# Patient Record
Sex: Female | Born: 1940 | Race: White | Hispanic: No | Marital: Single | Smoking: Never smoker
Health system: Southern US, Community
[De-identification: ages and names within clinical notes are randomized; demographics above are authoritative.]

## PROBLEM LIST (undated history)

## (undated) DIAGNOSIS — K5792 Diverticulitis of intestine, part unspecified, without perforation or abscess without bleeding: Secondary | ICD-10-CM

## (undated) DIAGNOSIS — I83893 Varicose veins of bilateral lower extremities with other complications: Secondary | ICD-10-CM

## (undated) DIAGNOSIS — E785 Hyperlipidemia, unspecified: Secondary | ICD-10-CM

## (undated) DIAGNOSIS — R519 Headache, unspecified: Secondary | ICD-10-CM

## (undated) DIAGNOSIS — H9190 Unspecified hearing loss, unspecified ear: Secondary | ICD-10-CM

## (undated) DIAGNOSIS — I10 Essential (primary) hypertension: Secondary | ICD-10-CM

## (undated) DIAGNOSIS — R51 Headache: Secondary | ICD-10-CM

## (undated) DIAGNOSIS — E079 Disorder of thyroid, unspecified: Secondary | ICD-10-CM

## (undated) HISTORY — PX: KNEE ARTHROSCOPY: SUR90

## (undated) HISTORY — DX: Unspecified hearing loss, unspecified ear: H91.90

## (undated) HISTORY — PX: OVARY SURGERY: SHX727

## (undated) HISTORY — PX: KNEE SURGERY: SHX244

## (undated) HISTORY — PX: OTHER SURGICAL HISTORY: SHX169

## (undated) HISTORY — DX: Hyperlipidemia, unspecified: E78.5

## (undated) HISTORY — DX: Headache, unspecified: R51.9

## (undated) HISTORY — DX: Essential (primary) hypertension: I10

## (undated) HISTORY — PX: COLONOSCOPY W/ POLYPECTOMY: SHX1380

## (undated) HISTORY — PX: CHOLECYSTECTOMY: SHX55

## (undated) HISTORY — DX: Varicose veins of bilateral lower extremities with other complications: I83.893

## (undated) HISTORY — DX: Headache: R51

---

## 2011-05-29 DIAGNOSIS — R918 Other nonspecific abnormal finding of lung field: Secondary | ICD-10-CM | POA: Diagnosis not present

## 2011-06-14 DIAGNOSIS — E785 Hyperlipidemia, unspecified: Secondary | ICD-10-CM | POA: Diagnosis not present

## 2011-07-30 DIAGNOSIS — J45909 Unspecified asthma, uncomplicated: Secondary | ICD-10-CM | POA: Diagnosis not present

## 2011-07-30 DIAGNOSIS — R05 Cough: Secondary | ICD-10-CM | POA: Diagnosis not present

## 2011-11-17 DIAGNOSIS — R918 Other nonspecific abnormal finding of lung field: Secondary | ICD-10-CM | POA: Diagnosis not present

## 2011-12-21 DIAGNOSIS — E782 Mixed hyperlipidemia: Secondary | ICD-10-CM | POA: Diagnosis not present

## 2011-12-21 DIAGNOSIS — R002 Palpitations: Secondary | ICD-10-CM | POA: Diagnosis not present

## 2011-12-21 DIAGNOSIS — K219 Gastro-esophageal reflux disease without esophagitis: Secondary | ICD-10-CM | POA: Diagnosis not present

## 2011-12-21 DIAGNOSIS — E038 Other specified hypothyroidism: Secondary | ICD-10-CM | POA: Diagnosis not present

## 2011-12-21 DIAGNOSIS — Z23 Encounter for immunization: Secondary | ICD-10-CM | POA: Diagnosis not present

## 2011-12-26 DIAGNOSIS — E039 Hypothyroidism, unspecified: Secondary | ICD-10-CM | POA: Diagnosis not present

## 2011-12-26 DIAGNOSIS — E785 Hyperlipidemia, unspecified: Secondary | ICD-10-CM | POA: Diagnosis not present

## 2011-12-26 DIAGNOSIS — R002 Palpitations: Secondary | ICD-10-CM | POA: Diagnosis not present

## 2011-12-26 DIAGNOSIS — Z79899 Other long term (current) drug therapy: Secondary | ICD-10-CM | POA: Diagnosis not present

## 2011-12-28 DIAGNOSIS — R9431 Abnormal electrocardiogram [ECG] [EKG]: Secondary | ICD-10-CM | POA: Diagnosis not present

## 2011-12-30 DIAGNOSIS — R002 Palpitations: Secondary | ICD-10-CM | POA: Diagnosis not present

## 2012-01-01 DIAGNOSIS — R141 Gas pain: Secondary | ICD-10-CM | POA: Diagnosis not present

## 2012-01-01 DIAGNOSIS — R109 Unspecified abdominal pain: Secondary | ICD-10-CM | POA: Diagnosis not present

## 2012-01-01 DIAGNOSIS — Z8371 Family history of colonic polyps: Secondary | ICD-10-CM | POA: Diagnosis not present

## 2012-01-01 DIAGNOSIS — Z86010 Personal history of colonic polyps: Secondary | ICD-10-CM | POA: Diagnosis not present

## 2012-01-01 DIAGNOSIS — R1013 Epigastric pain: Secondary | ICD-10-CM | POA: Diagnosis not present

## 2012-01-01 DIAGNOSIS — R143 Flatulence: Secondary | ICD-10-CM | POA: Diagnosis not present

## 2012-01-01 DIAGNOSIS — Z800 Family history of malignant neoplasm of digestive organs: Secondary | ICD-10-CM | POA: Diagnosis not present

## 2012-01-03 DIAGNOSIS — R002 Palpitations: Secondary | ICD-10-CM | POA: Diagnosis not present

## 2012-02-06 DIAGNOSIS — R002 Palpitations: Secondary | ICD-10-CM | POA: Diagnosis not present

## 2012-03-22 DIAGNOSIS — L719 Rosacea, unspecified: Secondary | ICD-10-CM | POA: Diagnosis not present

## 2012-03-22 DIAGNOSIS — K219 Gastro-esophageal reflux disease without esophagitis: Secondary | ICD-10-CM | POA: Diagnosis not present

## 2012-03-22 DIAGNOSIS — J019 Acute sinusitis, unspecified: Secondary | ICD-10-CM | POA: Diagnosis not present

## 2012-03-22 DIAGNOSIS — K2960 Other gastritis without bleeding: Secondary | ICD-10-CM | POA: Diagnosis not present

## 2012-04-29 DIAGNOSIS — R1032 Left lower quadrant pain: Secondary | ICD-10-CM | POA: Diagnosis not present

## 2012-04-29 DIAGNOSIS — R350 Frequency of micturition: Secondary | ICD-10-CM | POA: Diagnosis not present

## 2012-04-29 DIAGNOSIS — R928 Other abnormal and inconclusive findings on diagnostic imaging of breast: Secondary | ICD-10-CM | POA: Diagnosis not present

## 2012-04-29 DIAGNOSIS — R198 Other specified symptoms and signs involving the digestive system and abdomen: Secondary | ICD-10-CM | POA: Diagnosis not present

## 2012-04-29 DIAGNOSIS — R922 Inconclusive mammogram: Secondary | ICD-10-CM | POA: Diagnosis not present

## 2012-04-29 DIAGNOSIS — R141 Gas pain: Secondary | ICD-10-CM | POA: Diagnosis not present

## 2012-04-29 DIAGNOSIS — R142 Eructation: Secondary | ICD-10-CM | POA: Diagnosis not present

## 2012-05-13 DIAGNOSIS — Z8371 Family history of colonic polyps: Secondary | ICD-10-CM | POA: Diagnosis not present

## 2012-05-13 DIAGNOSIS — D126 Benign neoplasm of colon, unspecified: Secondary | ICD-10-CM | POA: Diagnosis not present

## 2012-05-13 DIAGNOSIS — Z800 Family history of malignant neoplasm of digestive organs: Secondary | ICD-10-CM | POA: Diagnosis not present

## 2012-05-13 DIAGNOSIS — R109 Unspecified abdominal pain: Secondary | ICD-10-CM | POA: Diagnosis not present

## 2012-05-13 DIAGNOSIS — R143 Flatulence: Secondary | ICD-10-CM | POA: Diagnosis not present

## 2012-05-13 DIAGNOSIS — R142 Eructation: Secondary | ICD-10-CM | POA: Diagnosis not present

## 2012-05-13 DIAGNOSIS — R141 Gas pain: Secondary | ICD-10-CM | POA: Diagnosis not present

## 2012-05-16 DIAGNOSIS — R1084 Generalized abdominal pain: Secondary | ICD-10-CM | POA: Diagnosis not present

## 2012-05-29 DIAGNOSIS — Z5181 Encounter for therapeutic drug level monitoring: Secondary | ICD-10-CM | POA: Diagnosis not present

## 2012-05-29 DIAGNOSIS — R1013 Epigastric pain: Secondary | ICD-10-CM | POA: Diagnosis not present

## 2012-05-29 DIAGNOSIS — R1310 Dysphagia, unspecified: Secondary | ICD-10-CM | POA: Diagnosis not present

## 2012-05-29 DIAGNOSIS — R141 Gas pain: Secondary | ICD-10-CM | POA: Diagnosis not present

## 2012-05-29 DIAGNOSIS — K228 Other specified diseases of esophagus: Secondary | ICD-10-CM | POA: Diagnosis not present

## 2012-05-29 DIAGNOSIS — K219 Gastro-esophageal reflux disease without esophagitis: Secondary | ICD-10-CM | POA: Diagnosis not present

## 2012-05-29 DIAGNOSIS — R143 Flatulence: Secondary | ICD-10-CM | POA: Diagnosis not present

## 2012-10-17 DIAGNOSIS — K219 Gastro-esophageal reflux disease without esophagitis: Secondary | ICD-10-CM | POA: Diagnosis not present

## 2012-10-17 DIAGNOSIS — E782 Mixed hyperlipidemia: Secondary | ICD-10-CM | POA: Diagnosis not present

## 2012-10-17 DIAGNOSIS — E038 Other specified hypothyroidism: Secondary | ICD-10-CM | POA: Diagnosis not present

## 2012-10-17 DIAGNOSIS — J018 Other acute sinusitis: Secondary | ICD-10-CM | POA: Diagnosis not present

## 2012-11-12 DIAGNOSIS — Z79899 Other long term (current) drug therapy: Secondary | ICD-10-CM | POA: Diagnosis not present

## 2012-11-12 DIAGNOSIS — E039 Hypothyroidism, unspecified: Secondary | ICD-10-CM | POA: Diagnosis not present

## 2012-11-12 DIAGNOSIS — E785 Hyperlipidemia, unspecified: Secondary | ICD-10-CM | POA: Diagnosis not present

## 2012-11-12 DIAGNOSIS — Z139 Encounter for screening, unspecified: Secondary | ICD-10-CM | POA: Diagnosis not present

## 2013-01-07 DIAGNOSIS — M999 Biomechanical lesion, unspecified: Secondary | ICD-10-CM | POA: Diagnosis not present

## 2013-01-07 DIAGNOSIS — IMO0002 Reserved for concepts with insufficient information to code with codable children: Secondary | ICD-10-CM | POA: Diagnosis not present

## 2013-01-09 DIAGNOSIS — M999 Biomechanical lesion, unspecified: Secondary | ICD-10-CM | POA: Diagnosis not present

## 2013-01-09 DIAGNOSIS — IMO0002 Reserved for concepts with insufficient information to code with codable children: Secondary | ICD-10-CM | POA: Diagnosis not present

## 2013-01-10 DIAGNOSIS — M999 Biomechanical lesion, unspecified: Secondary | ICD-10-CM | POA: Diagnosis not present

## 2013-01-10 DIAGNOSIS — IMO0002 Reserved for concepts with insufficient information to code with codable children: Secondary | ICD-10-CM | POA: Diagnosis not present

## 2013-01-30 DIAGNOSIS — Z23 Encounter for immunization: Secondary | ICD-10-CM | POA: Diagnosis not present

## 2013-02-05 DIAGNOSIS — H903 Sensorineural hearing loss, bilateral: Secondary | ICD-10-CM | POA: Diagnosis not present

## 2013-02-13 DIAGNOSIS — L570 Actinic keratosis: Secondary | ICD-10-CM | POA: Diagnosis not present

## 2013-02-13 DIAGNOSIS — L819 Disorder of pigmentation, unspecified: Secondary | ICD-10-CM | POA: Diagnosis not present

## 2013-02-13 DIAGNOSIS — D235 Other benign neoplasm of skin of trunk: Secondary | ICD-10-CM | POA: Diagnosis not present

## 2013-02-26 DIAGNOSIS — G3184 Mild cognitive impairment, so stated: Secondary | ICD-10-CM | POA: Diagnosis not present

## 2013-02-26 DIAGNOSIS — E785 Hyperlipidemia, unspecified: Secondary | ICD-10-CM | POA: Diagnosis not present

## 2013-02-26 DIAGNOSIS — E039 Hypothyroidism, unspecified: Secondary | ICD-10-CM | POA: Diagnosis not present

## 2013-02-26 DIAGNOSIS — Z1331 Encounter for screening for depression: Secondary | ICD-10-CM | POA: Diagnosis not present

## 2013-02-26 DIAGNOSIS — M25569 Pain in unspecified knee: Secondary | ICD-10-CM | POA: Diagnosis not present

## 2013-03-13 DIAGNOSIS — M25569 Pain in unspecified knee: Secondary | ICD-10-CM | POA: Diagnosis not present

## 2013-03-18 DIAGNOSIS — M25569 Pain in unspecified knee: Secondary | ICD-10-CM | POA: Diagnosis not present

## 2013-03-20 DIAGNOSIS — M25569 Pain in unspecified knee: Secondary | ICD-10-CM | POA: Diagnosis not present

## 2013-03-27 DIAGNOSIS — L570 Actinic keratosis: Secondary | ICD-10-CM | POA: Diagnosis not present

## 2013-04-15 DIAGNOSIS — R509 Fever, unspecified: Secondary | ICD-10-CM | POA: Diagnosis not present

## 2013-04-15 DIAGNOSIS — J019 Acute sinusitis, unspecified: Secondary | ICD-10-CM | POA: Diagnosis not present

## 2013-04-30 DIAGNOSIS — M6750 Plica syndrome, unspecified knee: Secondary | ICD-10-CM | POA: Diagnosis not present

## 2013-04-30 DIAGNOSIS — IMO0002 Reserved for concepts with insufficient information to code with codable children: Secondary | ICD-10-CM | POA: Diagnosis not present

## 2013-04-30 DIAGNOSIS — G8918 Other acute postprocedural pain: Secondary | ICD-10-CM | POA: Diagnosis not present

## 2013-04-30 DIAGNOSIS — M23329 Other meniscus derangements, posterior horn of medial meniscus, unspecified knee: Secondary | ICD-10-CM | POA: Diagnosis not present

## 2013-05-06 ENCOUNTER — Other Ambulatory Visit (HOSPITAL_COMMUNITY): Payer: Self-pay | Admitting: Cardiology

## 2013-05-06 ENCOUNTER — Encounter: Payer: Self-pay | Admitting: Cardiology

## 2013-05-06 ENCOUNTER — Ambulatory Visit (HOSPITAL_COMMUNITY): Payer: Medicare Other | Attending: Cardiology

## 2013-05-06 DIAGNOSIS — E785 Hyperlipidemia, unspecified: Secondary | ICD-10-CM | POA: Diagnosis not present

## 2013-05-06 DIAGNOSIS — M7989 Other specified soft tissue disorders: Secondary | ICD-10-CM | POA: Diagnosis not present

## 2013-05-06 DIAGNOSIS — M79609 Pain in unspecified limb: Secondary | ICD-10-CM

## 2013-05-06 DIAGNOSIS — Z87891 Personal history of nicotine dependence: Secondary | ICD-10-CM | POA: Diagnosis not present

## 2013-05-15 DIAGNOSIS — IMO0002 Reserved for concepts with insufficient information to code with codable children: Secondary | ICD-10-CM | POA: Diagnosis not present

## 2013-05-15 DIAGNOSIS — M25569 Pain in unspecified knee: Secondary | ICD-10-CM | POA: Diagnosis not present

## 2013-05-19 DIAGNOSIS — M25579 Pain in unspecified ankle and joints of unspecified foot: Secondary | ICD-10-CM | POA: Diagnosis not present

## 2013-05-19 DIAGNOSIS — M79609 Pain in unspecified limb: Secondary | ICD-10-CM | POA: Diagnosis not present

## 2013-05-26 DIAGNOSIS — IMO0002 Reserved for concepts with insufficient information to code with codable children: Secondary | ICD-10-CM | POA: Diagnosis not present

## 2013-05-26 DIAGNOSIS — M999 Biomechanical lesion, unspecified: Secondary | ICD-10-CM | POA: Diagnosis not present

## 2013-05-30 DIAGNOSIS — IMO0002 Reserved for concepts with insufficient information to code with codable children: Secondary | ICD-10-CM | POA: Diagnosis not present

## 2013-05-30 DIAGNOSIS — M999 Biomechanical lesion, unspecified: Secondary | ICD-10-CM | POA: Diagnosis not present

## 2013-06-04 DIAGNOSIS — M999 Biomechanical lesion, unspecified: Secondary | ICD-10-CM | POA: Diagnosis not present

## 2013-06-04 DIAGNOSIS — IMO0002 Reserved for concepts with insufficient information to code with codable children: Secondary | ICD-10-CM | POA: Diagnosis not present

## 2013-06-06 DIAGNOSIS — M999 Biomechanical lesion, unspecified: Secondary | ICD-10-CM | POA: Diagnosis not present

## 2013-06-06 DIAGNOSIS — IMO0002 Reserved for concepts with insufficient information to code with codable children: Secondary | ICD-10-CM | POA: Diagnosis not present

## 2013-06-09 DIAGNOSIS — IMO0002 Reserved for concepts with insufficient information to code with codable children: Secondary | ICD-10-CM | POA: Diagnosis not present

## 2013-06-09 DIAGNOSIS — M999 Biomechanical lesion, unspecified: Secondary | ICD-10-CM | POA: Diagnosis not present

## 2013-06-13 DIAGNOSIS — IMO0002 Reserved for concepts with insufficient information to code with codable children: Secondary | ICD-10-CM | POA: Diagnosis not present

## 2013-06-13 DIAGNOSIS — M999 Biomechanical lesion, unspecified: Secondary | ICD-10-CM | POA: Diagnosis not present

## 2013-11-10 DIAGNOSIS — M899 Disorder of bone, unspecified: Secondary | ICD-10-CM | POA: Diagnosis not present

## 2013-11-10 DIAGNOSIS — E785 Hyperlipidemia, unspecified: Secondary | ICD-10-CM | POA: Diagnosis not present

## 2013-11-10 DIAGNOSIS — M949 Disorder of cartilage, unspecified: Secondary | ICD-10-CM | POA: Diagnosis not present

## 2013-11-10 DIAGNOSIS — R269 Unspecified abnormalities of gait and mobility: Secondary | ICD-10-CM | POA: Diagnosis not present

## 2013-11-10 DIAGNOSIS — E039 Hypothyroidism, unspecified: Secondary | ICD-10-CM | POA: Diagnosis not present

## 2013-11-10 DIAGNOSIS — Z9181 History of falling: Secondary | ICD-10-CM | POA: Diagnosis not present

## 2013-11-10 DIAGNOSIS — L989 Disorder of the skin and subcutaneous tissue, unspecified: Secondary | ICD-10-CM | POA: Diagnosis not present

## 2014-01-14 DIAGNOSIS — H903 Sensorineural hearing loss, bilateral: Secondary | ICD-10-CM | POA: Diagnosis not present

## 2014-01-14 DIAGNOSIS — G501 Atypical facial pain: Secondary | ICD-10-CM | POA: Diagnosis not present

## 2014-02-02 DIAGNOSIS — Z23 Encounter for immunization: Secondary | ICD-10-CM | POA: Diagnosis not present

## 2014-02-20 DIAGNOSIS — M792 Neuralgia and neuritis, unspecified: Secondary | ICD-10-CM | POA: Diagnosis not present

## 2014-02-20 DIAGNOSIS — J019 Acute sinusitis, unspecified: Secondary | ICD-10-CM | POA: Diagnosis not present

## 2014-02-20 DIAGNOSIS — E785 Hyperlipidemia, unspecified: Secondary | ICD-10-CM | POA: Diagnosis not present

## 2014-03-11 DIAGNOSIS — Z9889 Other specified postprocedural states: Secondary | ICD-10-CM | POA: Diagnosis not present

## 2014-03-11 DIAGNOSIS — S46811A Strain of other muscles, fascia and tendons at shoulder and upper arm level, right arm, initial encounter: Secondary | ICD-10-CM | POA: Diagnosis not present

## 2014-03-12 DIAGNOSIS — Z803 Family history of malignant neoplasm of breast: Secondary | ICD-10-CM | POA: Diagnosis not present

## 2014-03-12 DIAGNOSIS — M546 Pain in thoracic spine: Secondary | ICD-10-CM | POA: Diagnosis not present

## 2014-03-12 DIAGNOSIS — M9902 Segmental and somatic dysfunction of thoracic region: Secondary | ICD-10-CM | POA: Diagnosis not present

## 2014-03-12 DIAGNOSIS — M5134 Other intervertebral disc degeneration, thoracic region: Secondary | ICD-10-CM | POA: Diagnosis not present

## 2014-03-12 DIAGNOSIS — Z1231 Encounter for screening mammogram for malignant neoplasm of breast: Secondary | ICD-10-CM | POA: Diagnosis not present

## 2014-03-12 DIAGNOSIS — M62838 Other muscle spasm: Secondary | ICD-10-CM | POA: Diagnosis not present

## 2014-03-17 DIAGNOSIS — M546 Pain in thoracic spine: Secondary | ICD-10-CM | POA: Diagnosis not present

## 2014-03-17 DIAGNOSIS — M5134 Other intervertebral disc degeneration, thoracic region: Secondary | ICD-10-CM | POA: Diagnosis not present

## 2014-03-17 DIAGNOSIS — M9902 Segmental and somatic dysfunction of thoracic region: Secondary | ICD-10-CM | POA: Diagnosis not present

## 2014-03-17 DIAGNOSIS — M62838 Other muscle spasm: Secondary | ICD-10-CM | POA: Diagnosis not present

## 2014-03-20 DIAGNOSIS — M5134 Other intervertebral disc degeneration, thoracic region: Secondary | ICD-10-CM | POA: Diagnosis not present

## 2014-03-20 DIAGNOSIS — M546 Pain in thoracic spine: Secondary | ICD-10-CM | POA: Diagnosis not present

## 2014-03-20 DIAGNOSIS — M62838 Other muscle spasm: Secondary | ICD-10-CM | POA: Diagnosis not present

## 2014-03-20 DIAGNOSIS — M9902 Segmental and somatic dysfunction of thoracic region: Secondary | ICD-10-CM | POA: Diagnosis not present

## 2014-03-24 DIAGNOSIS — M5134 Other intervertebral disc degeneration, thoracic region: Secondary | ICD-10-CM | POA: Diagnosis not present

## 2014-03-24 DIAGNOSIS — M546 Pain in thoracic spine: Secondary | ICD-10-CM | POA: Diagnosis not present

## 2014-03-24 DIAGNOSIS — M62838 Other muscle spasm: Secondary | ICD-10-CM | POA: Diagnosis not present

## 2014-03-24 DIAGNOSIS — M9902 Segmental and somatic dysfunction of thoracic region: Secondary | ICD-10-CM | POA: Diagnosis not present

## 2014-03-31 DIAGNOSIS — R05 Cough: Secondary | ICD-10-CM | POA: Diagnosis not present

## 2014-03-31 DIAGNOSIS — B349 Viral infection, unspecified: Secondary | ICD-10-CM | POA: Diagnosis not present

## 2014-04-17 DIAGNOSIS — Z9889 Other specified postprocedural states: Secondary | ICD-10-CM | POA: Diagnosis not present

## 2014-04-22 ENCOUNTER — Other Ambulatory Visit: Payer: Self-pay | Admitting: Internal Medicine

## 2014-04-22 ENCOUNTER — Ambulatory Visit
Admission: RE | Admit: 2014-04-22 | Discharge: 2014-04-22 | Disposition: A | Discharge status: Home / Self Care | Payer: Medicare Other | Source: Ambulatory Visit | Attending: Internal Medicine | Admitting: Internal Medicine

## 2014-04-22 DIAGNOSIS — R918 Other nonspecific abnormal finding of lung field: Secondary | ICD-10-CM | POA: Diagnosis not present

## 2014-04-22 DIAGNOSIS — R05 Cough: Secondary | ICD-10-CM

## 2014-04-22 DIAGNOSIS — R059 Cough, unspecified: Secondary | ICD-10-CM

## 2014-04-22 DIAGNOSIS — R112 Nausea with vomiting, unspecified: Secondary | ICD-10-CM | POA: Diagnosis not present

## 2014-04-23 ENCOUNTER — Other Ambulatory Visit: Payer: Self-pay | Admitting: Internal Medicine

## 2014-04-23 DIAGNOSIS — R911 Solitary pulmonary nodule: Secondary | ICD-10-CM

## 2014-04-27 ENCOUNTER — Ambulatory Visit
Admission: RE | Admit: 2014-04-27 | Discharge: 2014-04-27 | Disposition: A | Discharge status: Home / Self Care | Payer: Medicare Other | Source: Ambulatory Visit | Attending: Internal Medicine | Admitting: Internal Medicine

## 2014-04-27 DIAGNOSIS — R918 Other nonspecific abnormal finding of lung field: Secondary | ICD-10-CM | POA: Diagnosis not present

## 2014-04-27 DIAGNOSIS — R911 Solitary pulmonary nodule: Secondary | ICD-10-CM

## 2014-04-27 DIAGNOSIS — J8410 Pulmonary fibrosis, unspecified: Secondary | ICD-10-CM | POA: Diagnosis not present

## 2014-04-27 DIAGNOSIS — J929 Pleural plaque without asbestos: Secondary | ICD-10-CM | POA: Diagnosis not present

## 2014-04-29 DIAGNOSIS — J8410 Pulmonary fibrosis, unspecified: Secondary | ICD-10-CM | POA: Diagnosis not present

## 2014-04-29 DIAGNOSIS — R05 Cough: Secondary | ICD-10-CM | POA: Diagnosis not present

## 2014-04-29 DIAGNOSIS — N281 Cyst of kidney, acquired: Secondary | ICD-10-CM | POA: Diagnosis not present

## 2014-04-30 ENCOUNTER — Other Ambulatory Visit: Payer: Self-pay | Admitting: Internal Medicine

## 2014-04-30 DIAGNOSIS — J8410 Pulmonary fibrosis, unspecified: Secondary | ICD-10-CM

## 2014-04-30 DIAGNOSIS — N281 Cyst of kidney, acquired: Secondary | ICD-10-CM

## 2014-05-05 ENCOUNTER — Other Ambulatory Visit: Payer: Self-pay | Admitting: Internal Medicine

## 2014-05-05 ENCOUNTER — Ambulatory Visit
Admission: RE | Admit: 2014-05-05 | Discharge: 2014-05-05 | Disposition: A | Discharge status: Home / Self Care | Payer: Medicare Other | Source: Ambulatory Visit | Attending: Internal Medicine | Admitting: Internal Medicine

## 2014-05-05 DIAGNOSIS — R934 Abnormal findings on diagnostic imaging of urinary organs: Secondary | ICD-10-CM | POA: Diagnosis not present

## 2014-05-05 DIAGNOSIS — J8410 Pulmonary fibrosis, unspecified: Secondary | ICD-10-CM

## 2014-05-05 DIAGNOSIS — N281 Cyst of kidney, acquired: Secondary | ICD-10-CM | POA: Diagnosis not present

## 2014-05-05 DIAGNOSIS — R9341 Abnormal radiologic findings on diagnostic imaging of renal pelvis, ureter, or bladder: Secondary | ICD-10-CM

## 2014-05-11 ENCOUNTER — Ambulatory Visit
Admission: RE | Admit: 2014-05-11 | Discharge: 2014-05-11 | Disposition: A | Discharge status: Home / Self Care | Payer: Medicare Other | Source: Ambulatory Visit | Attending: Internal Medicine | Admitting: Internal Medicine

## 2014-05-11 DIAGNOSIS — R9341 Abnormal radiologic findings on diagnostic imaging of renal pelvis, ureter, or bladder: Secondary | ICD-10-CM

## 2014-05-11 DIAGNOSIS — N281 Cyst of kidney, acquired: Secondary | ICD-10-CM | POA: Diagnosis not present

## 2014-05-11 DIAGNOSIS — I708 Atherosclerosis of other arteries: Secondary | ICD-10-CM | POA: Diagnosis not present

## 2014-05-11 DIAGNOSIS — M47816 Spondylosis without myelopathy or radiculopathy, lumbar region: Secondary | ICD-10-CM | POA: Diagnosis not present

## 2014-05-11 DIAGNOSIS — I700 Atherosclerosis of aorta: Secondary | ICD-10-CM | POA: Diagnosis not present

## 2014-05-11 MED ORDER — IOHEXOL 300 MG/ML  SOLN
100.0000 mL | Freq: Once | INTRAMUSCULAR | Status: AC | PRN
  Administered 2014-05-11: 100 mL via INTRAVENOUS

## 2014-05-13 DIAGNOSIS — M9902 Segmental and somatic dysfunction of thoracic region: Secondary | ICD-10-CM | POA: Diagnosis not present

## 2014-05-13 DIAGNOSIS — M546 Pain in thoracic spine: Secondary | ICD-10-CM | POA: Diagnosis not present

## 2014-05-13 DIAGNOSIS — M62838 Other muscle spasm: Secondary | ICD-10-CM | POA: Diagnosis not present

## 2014-05-13 DIAGNOSIS — M5134 Other intervertebral disc degeneration, thoracic region: Secondary | ICD-10-CM | POA: Diagnosis not present

## 2014-05-15 DIAGNOSIS — M62838 Other muscle spasm: Secondary | ICD-10-CM | POA: Diagnosis not present

## 2014-05-15 DIAGNOSIS — M546 Pain in thoracic spine: Secondary | ICD-10-CM | POA: Diagnosis not present

## 2014-05-15 DIAGNOSIS — M5134 Other intervertebral disc degeneration, thoracic region: Secondary | ICD-10-CM | POA: Diagnosis not present

## 2014-05-15 DIAGNOSIS — M9902 Segmental and somatic dysfunction of thoracic region: Secondary | ICD-10-CM | POA: Diagnosis not present

## 2014-05-18 DIAGNOSIS — M5134 Other intervertebral disc degeneration, thoracic region: Secondary | ICD-10-CM | POA: Diagnosis not present

## 2014-05-18 DIAGNOSIS — M546 Pain in thoracic spine: Secondary | ICD-10-CM | POA: Diagnosis not present

## 2014-05-18 DIAGNOSIS — M62838 Other muscle spasm: Secondary | ICD-10-CM | POA: Diagnosis not present

## 2014-05-18 DIAGNOSIS — M9902 Segmental and somatic dysfunction of thoracic region: Secondary | ICD-10-CM | POA: Diagnosis not present

## 2014-06-04 DIAGNOSIS — M8580 Other specified disorders of bone density and structure, unspecified site: Secondary | ICD-10-CM | POA: Diagnosis not present

## 2014-06-04 DIAGNOSIS — Z23 Encounter for immunization: Secondary | ICD-10-CM | POA: Diagnosis not present

## 2014-06-04 DIAGNOSIS — M47814 Spondylosis without myelopathy or radiculopathy, thoracic region: Secondary | ICD-10-CM | POA: Diagnosis not present

## 2014-06-04 DIAGNOSIS — Z1389 Encounter for screening for other disorder: Secondary | ICD-10-CM | POA: Diagnosis not present

## 2014-06-04 DIAGNOSIS — E785 Hyperlipidemia, unspecified: Secondary | ICD-10-CM | POA: Diagnosis not present

## 2014-06-04 DIAGNOSIS — Z0000 Encounter for general adult medical examination without abnormal findings: Secondary | ICD-10-CM | POA: Diagnosis not present

## 2014-06-04 DIAGNOSIS — G3184 Mild cognitive impairment, so stated: Secondary | ICD-10-CM | POA: Diagnosis not present

## 2014-07-09 DIAGNOSIS — M899 Disorder of bone, unspecified: Secondary | ICD-10-CM | POA: Diagnosis not present

## 2014-07-09 DIAGNOSIS — M8580 Other specified disorders of bone density and structure, unspecified site: Secondary | ICD-10-CM | POA: Diagnosis not present

## 2014-09-23 DIAGNOSIS — K625 Hemorrhage of anus and rectum: Secondary | ICD-10-CM | POA: Diagnosis not present

## 2014-09-23 DIAGNOSIS — K5792 Diverticulitis of intestine, part unspecified, without perforation or abscess without bleeding: Secondary | ICD-10-CM | POA: Diagnosis not present

## 2014-09-25 DIAGNOSIS — K625 Hemorrhage of anus and rectum: Secondary | ICD-10-CM | POA: Diagnosis not present

## 2014-09-25 DIAGNOSIS — K5792 Diverticulitis of intestine, part unspecified, without perforation or abscess without bleeding: Secondary | ICD-10-CM | POA: Diagnosis not present

## 2014-10-02 ENCOUNTER — Other Ambulatory Visit: Payer: Self-pay | Admitting: Gastroenterology

## 2014-10-02 DIAGNOSIS — R109 Unspecified abdominal pain: Secondary | ICD-10-CM | POA: Diagnosis not present

## 2014-10-02 DIAGNOSIS — R300 Dysuria: Secondary | ICD-10-CM | POA: Diagnosis not present

## 2014-10-02 DIAGNOSIS — K921 Melena: Secondary | ICD-10-CM | POA: Diagnosis not present

## 2014-10-02 DIAGNOSIS — R1032 Left lower quadrant pain: Secondary | ICD-10-CM

## 2014-10-06 ENCOUNTER — Ambulatory Visit
Admission: RE | Admit: 2014-10-06 | Discharge: 2014-10-06 | Disposition: A | Discharge status: Home / Self Care | Payer: Medicare Other | Source: Ambulatory Visit | Attending: Gastroenterology | Admitting: Gastroenterology

## 2014-10-06 DIAGNOSIS — K921 Melena: Secondary | ICD-10-CM | POA: Diagnosis not present

## 2014-10-06 DIAGNOSIS — R1032 Left lower quadrant pain: Secondary | ICD-10-CM

## 2014-10-06 MED ORDER — IOPAMIDOL (ISOVUE-300) INJECTION 61%
100.0000 mL | Freq: Once | INTRAVENOUS | Status: AC | PRN
  Administered 2014-10-06: 100 mL via INTRAVENOUS

## 2014-10-16 ENCOUNTER — Emergency Department (HOSPITAL_COMMUNITY): Payer: Medicare Other

## 2014-10-16 ENCOUNTER — Emergency Department (HOSPITAL_COMMUNITY)
Admission: EM | Admit: 2014-10-16 | Discharge: 2014-10-16 | Disposition: A | Discharge status: Home / Self Care | Payer: Medicare Other | Attending: Emergency Medicine | Admitting: Emergency Medicine

## 2014-10-16 ENCOUNTER — Encounter (HOSPITAL_COMMUNITY): Payer: Self-pay | Admitting: Emergency Medicine

## 2014-10-16 DIAGNOSIS — R1032 Left lower quadrant pain: Secondary | ICD-10-CM | POA: Insufficient documentation

## 2014-10-16 DIAGNOSIS — Z8639 Personal history of other endocrine, nutritional and metabolic disease: Secondary | ICD-10-CM | POA: Insufficient documentation

## 2014-10-16 DIAGNOSIS — Z8719 Personal history of other diseases of the digestive system: Secondary | ICD-10-CM | POA: Diagnosis not present

## 2014-10-16 DIAGNOSIS — Z9049 Acquired absence of other specified parts of digestive tract: Secondary | ICD-10-CM | POA: Insufficient documentation

## 2014-10-16 DIAGNOSIS — R109 Unspecified abdominal pain: Secondary | ICD-10-CM

## 2014-10-16 HISTORY — DX: Diverticulitis of intestine, part unspecified, without perforation or abscess without bleeding: K57.92

## 2014-10-16 HISTORY — DX: Disorder of thyroid, unspecified: E07.9

## 2014-10-16 LAB — CBC WITH DIFFERENTIAL/PLATELET
Basophils Absolute: 0 10*3/uL (ref 0.0–0.1)
Basophils Relative: 0 % (ref 0–1)
EOS PCT: 2 % (ref 0–5)
Eosinophils Absolute: 0.1 10*3/uL (ref 0.0–0.7)
HEMATOCRIT: 35.6 % — AB (ref 36.0–46.0)
Hemoglobin: 11.9 g/dL — ABNORMAL LOW (ref 12.0–15.0)
LYMPHS PCT: 39 % (ref 12–46)
Lymphs Abs: 3 10*3/uL (ref 0.7–4.0)
MCH: 29.5 pg (ref 26.0–34.0)
MCHC: 33.4 g/dL (ref 30.0–36.0)
MCV: 88.3 fL (ref 78.0–100.0)
Monocytes Absolute: 1.1 10*3/uL — ABNORMAL HIGH (ref 0.1–1.0)
Monocytes Relative: 15 % — ABNORMAL HIGH (ref 3–12)
Neutro Abs: 3.4 10*3/uL (ref 1.7–7.7)
Neutrophils Relative %: 44 % (ref 43–77)
PLATELETS: 246 10*3/uL (ref 150–400)
RBC: 4.03 MIL/uL (ref 3.87–5.11)
RDW: 13.7 % (ref 11.5–15.5)
WBC: 7.6 10*3/uL (ref 4.0–10.5)

## 2014-10-16 LAB — URINALYSIS, ROUTINE W REFLEX MICROSCOPIC
Bilirubin Urine: NEGATIVE
Glucose, UA: NEGATIVE mg/dL
HGB URINE DIPSTICK: NEGATIVE
Ketones, ur: NEGATIVE mg/dL
NITRITE: NEGATIVE
PH: 5 (ref 5.0–8.0)
Protein, ur: NEGATIVE mg/dL
SPECIFIC GRAVITY, URINE: 1.023 (ref 1.005–1.030)
UROBILINOGEN UA: 0.2 mg/dL (ref 0.0–1.0)

## 2014-10-16 LAB — BASIC METABOLIC PANEL
ANION GAP: 6 (ref 5–15)
BUN: 26 mg/dL — ABNORMAL HIGH (ref 6–20)
CO2: 27 mmol/L (ref 22–32)
Calcium: 9 mg/dL (ref 8.9–10.3)
Chloride: 104 mmol/L (ref 101–111)
Creatinine, Ser: 0.84 mg/dL (ref 0.44–1.00)
GFR calc Af Amer: >60 mL/min (ref >60)
GFR calc non Af Amer: >60 mL/min (ref >60)
Glucose, Bld: 99 mg/dL (ref 65–99)
POTASSIUM: 3.4 mmol/L — AB (ref 3.5–5.1)
SODIUM: 137 mmol/L (ref 135–145)

## 2014-10-16 LAB — URINE MICROSCOPIC-ADD ON

## 2014-10-16 MED ORDER — HYDROCODONE-ACETAMINOPHEN 5-325 MG PO TABS: 1.0000 | ORAL_TABLET | ORAL | Status: DC | PRN

## 2014-10-16 NOTE — ED Notes (Signed)
Pt returned from xray

## 2014-10-16 NOTE — ED Notes (Signed)
Bed: TV47 Expected date:  Expected time:  Means of arrival:  Comments: Hold for triage 1

## 2014-10-16 NOTE — ED Notes (Signed)
Below order not completed by EW. 

## 2014-10-16 NOTE — ED Notes (Signed)
Per pt/family, left lower quadrant pain for 3 weeks-has been treated for diverticulitis with cipro

## 2014-10-16 NOTE — Discharge Instructions (Signed)

## 2014-10-16 NOTE — ED Provider Notes (Signed)
CSN: [id number]     Arrival date & time 10/16/14  1801 History   First MD Initiated Contact with Patient 10/16/14 1822     Chief Complaint  Patient presents with  . Abdominal Pain     (Consider location/radiation/quality/duration/timing/severity/associated sxs/prior Treatment) HPI Comments: Pt seen by gi and placed IB guard which didn't help--had neg abd ct on 6/26 tx for divert recently with abx  Patient is a 74 y.o. female presenting with abdominal pain. The history is provided by the patient and a relative.  Abdominal Pain Pain location:  LLQ Pain quality: aching and fullness   Pain radiates to:  Does not radiate Pain severity:  Moderate Onset quality:  Gradual Duration:  8 weeks Timing:  Constant Progression:  Unable to specify Chronicity:  Recurrent Worsened by:  Movement Associated symptoms: no chills, no fever, no flatus, no melena and no vomiting     Past Medical History  Diagnosis Date  . Diverticulitis   . Thyroid disease    Past Surgical History  Procedure Laterality Date  . Cholecystectomy     No family history on file. History  Substance Use Topics  . Smoking status: Never Smoker   . Smokeless tobacco: Not on file  . Alcohol Use: No   OB History    No data available     Review of Systems  Constitutional: Negative for fever and chills.  Gastrointestinal: Positive for abdominal pain. Negative for vomiting, melena and flatus.  All other systems reviewed and are negative.     Allergies  Review of patient's allergies indicates not on file.  Home Medications   Prior to Admission medications   Not on File   BP 140/72 mmHg  Pulse 93  Temp(Src) 98.3 F (36.8 C) (Oral)  Resp 16  SpO2 97% Physical Exam  Constitutional: She is oriented to person, place, and time. She appears well-developed and well-nourished.  Non-toxic appearance. No distress.  HENT:  Head: Normocephalic and atraumatic.  Eyes: Conjunctivae, EOM and lids are normal. Pupils are  equal, round, and reactive to light.  Neck: Normal range of motion. Neck supple. No tracheal deviation present. No thyroid mass present.  Cardiovascular: Normal rate, regular rhythm and normal heart sounds.  Exam reveals no gallop.   No murmur heard. Pulmonary/Chest: Effort normal and breath sounds normal. No stridor. No respiratory distress. She has no decreased breath sounds. She has no wheezes. She has no rhonchi. She has no rales.  Abdominal: Soft. Normal appearance and bowel sounds are normal. She exhibits no distension. There is tenderness in the left lower quadrant. There is no rigidity, no rebound, no guarding and no CVA tenderness.    Musculoskeletal: Normal range of motion. She exhibits no edema or tenderness.  Neurological: She is alert and oriented to person, place, and time. She has normal strength. No cranial nerve deficit or sensory deficit. GCS eye subscore is 4. GCS verbal subscore is 5. GCS motor subscore is 6.  Skin: Skin is warm and dry. No abrasion and no rash noted.  Psychiatric: She has a normal mood and affect. Her speech is normal and behavior is normal.  Nursing note and vitals reviewed.   ED Course  Procedures (including critical care time) Labs Review Labs Reviewed  BASIC METABOLIC PANEL  CBC WITH DIFFERENTIAL/PLATELET  URINALYSIS, ROUTINE W REFLEX MICROSCOPIC (NOT AT [facility])    Imaging Review No results found.   EKG Interpretation None      MDM   Final diagnoses:  Abdominal pain  Patient without acute findings on her workup here. Discussed with GI on call, and patient to follow-up with her gastroenterologist next week. Will prescribe pain medications here and the daughter requests a referral to general surgery.    Lacretia Leigh, MD 10/16/14 2017

## 2014-10-20 ENCOUNTER — Ambulatory Visit
Admission: RE | Admit: 2014-10-20 | Discharge: 2014-10-20 | Disposition: A | Discharge status: Home / Self Care | Payer: Medicare Other | Source: Ambulatory Visit | Attending: Internal Medicine | Admitting: Internal Medicine

## 2014-10-20 DIAGNOSIS — J8410 Pulmonary fibrosis, unspecified: Secondary | ICD-10-CM

## 2014-10-20 DIAGNOSIS — R918 Other nonspecific abnormal finding of lung field: Secondary | ICD-10-CM | POA: Diagnosis not present

## 2014-10-20 DIAGNOSIS — N281 Cyst of kidney, acquired: Secondary | ICD-10-CM

## 2014-11-20 ENCOUNTER — Other Ambulatory Visit: Payer: Self-pay | Admitting: Gastroenterology

## 2014-11-20 DIAGNOSIS — N281 Cyst of kidney, acquired: Secondary | ICD-10-CM

## 2014-11-20 DIAGNOSIS — J8410 Pulmonary fibrosis, unspecified: Secondary | ICD-10-CM

## 2014-11-25 DIAGNOSIS — R1032 Left lower quadrant pain: Secondary | ICD-10-CM | POA: Diagnosis not present

## 2014-12-03 DIAGNOSIS — M47816 Spondylosis without myelopathy or radiculopathy, lumbar region: Secondary | ICD-10-CM | POA: Diagnosis not present

## 2014-12-03 DIAGNOSIS — E039 Hypothyroidism, unspecified: Secondary | ICD-10-CM | POA: Diagnosis not present

## 2014-12-03 DIAGNOSIS — I700 Atherosclerosis of aorta: Secondary | ICD-10-CM | POA: Diagnosis not present

## 2014-12-03 DIAGNOSIS — R1032 Left lower quadrant pain: Secondary | ICD-10-CM | POA: Diagnosis not present

## 2014-12-03 DIAGNOSIS — E785 Hyperlipidemia, unspecified: Secondary | ICD-10-CM | POA: Diagnosis not present

## 2014-12-03 DIAGNOSIS — F329 Major depressive disorder, single episode, unspecified: Secondary | ICD-10-CM | POA: Diagnosis not present

## 2014-12-03 DIAGNOSIS — N9489 Other specified conditions associated with female genital organs and menstrual cycle: Secondary | ICD-10-CM | POA: Diagnosis not present

## 2014-12-18 DIAGNOSIS — I10 Essential (primary) hypertension: Secondary | ICD-10-CM | POA: Diagnosis not present

## 2014-12-18 DIAGNOSIS — R300 Dysuria: Secondary | ICD-10-CM | POA: Diagnosis not present

## 2014-12-18 DIAGNOSIS — R102 Pelvic and perineal pain: Secondary | ICD-10-CM | POA: Diagnosis not present

## 2014-12-18 DIAGNOSIS — K921 Melena: Secondary | ICD-10-CM | POA: Diagnosis not present

## 2014-12-21 ENCOUNTER — Telehealth: Payer: Self-pay | Admitting: Internal Medicine

## 2014-12-21 NOTE — Telephone Encounter (Signed)
Received [facility] records and placed on Dr. Blanch Media desk for review.

## 2014-12-24 DIAGNOSIS — R102 Pelvic and perineal pain: Secondary | ICD-10-CM | POA: Diagnosis not present

## 2014-12-30 ENCOUNTER — Encounter: Payer: Self-pay | Admitting: Gastroenterology

## 2015-01-27 ENCOUNTER — Telehealth: Payer: Self-pay

## 2015-01-27 ENCOUNTER — Ambulatory Visit (AMBULATORY_SURGERY_CENTER): Payer: Self-pay

## 2015-01-27 VITALS — Ht 63.5 in | Wt 145.0 lb

## 2015-01-27 DIAGNOSIS — Z860100 Personal history of colon polyps, unspecified: Secondary | ICD-10-CM

## 2015-01-27 MED ORDER — SUPREP BOWEL PREP KIT 17.5-3.13-1.6 GM/177ML PO SOLN: 1.0000 | Freq: Once | ORAL | Status: DC

## 2015-01-27 NOTE — Telephone Encounter (Signed)
Pt mentioned going to [facility] and ED; just double-checking she can proceed as direct colonoscopy and no OV.  Thank you, Arrington Bencomo/PV

## 2015-01-27 NOTE — Telephone Encounter (Signed)
Dr. Henrene Pastor reviewed records on this patient. Did you mean to send this to him?

## 2015-01-27 NOTE — Telephone Encounter (Signed)
Patient has been declined by Dr. Henrene Pastor and Dr. Fuller Plan. Has been recommended to tertiary hospital.

## 2015-01-27 NOTE — Progress Notes (Signed)
No allergies to eggs or soy No past problem with anesthesia No home oxygen No diet/weight loss meds  Refused emmi

## 2015-01-31 DIAGNOSIS — Z23 Encounter for immunization: Secondary | ICD-10-CM | POA: Diagnosis not present

## 2015-02-10 ENCOUNTER — Encounter: Payer: Medicare Other | Admitting: Gastroenterology

## 2015-03-09 DIAGNOSIS — F329 Major depressive disorder, single episode, unspecified: Secondary | ICD-10-CM | POA: Diagnosis not present

## 2015-03-09 DIAGNOSIS — M47816 Spondylosis without myelopathy or radiculopathy, lumbar region: Secondary | ICD-10-CM | POA: Diagnosis not present

## 2015-03-09 DIAGNOSIS — R109 Unspecified abdominal pain: Secondary | ICD-10-CM | POA: Diagnosis not present

## 2015-03-09 DIAGNOSIS — E785 Hyperlipidemia, unspecified: Secondary | ICD-10-CM | POA: Diagnosis not present

## 2015-05-05 DIAGNOSIS — Z1231 Encounter for screening mammogram for malignant neoplasm of breast: Secondary | ICD-10-CM | POA: Diagnosis not present

## 2015-05-16 DIAGNOSIS — R1032 Left lower quadrant pain: Secondary | ICD-10-CM | POA: Insufficient documentation

## 2015-05-16 DIAGNOSIS — D126 Benign neoplasm of colon, unspecified: Secondary | ICD-10-CM | POA: Insufficient documentation

## 2015-05-16 DIAGNOSIS — K921 Melena: Secondary | ICD-10-CM | POA: Insufficient documentation

## 2015-05-18 DIAGNOSIS — R12 Heartburn: Secondary | ICD-10-CM | POA: Diagnosis not present

## 2015-05-18 DIAGNOSIS — K921 Melena: Secondary | ICD-10-CM | POA: Diagnosis not present

## 2015-05-18 DIAGNOSIS — R1032 Left lower quadrant pain: Secondary | ICD-10-CM | POA: Diagnosis not present

## 2015-05-18 DIAGNOSIS — D126 Benign neoplasm of colon, unspecified: Secondary | ICD-10-CM | POA: Diagnosis not present

## 2015-05-18 DIAGNOSIS — R140 Abdominal distension (gaseous): Secondary | ICD-10-CM | POA: Insufficient documentation

## 2015-05-19 ENCOUNTER — Other Ambulatory Visit: Payer: Self-pay | Admitting: Internal Medicine

## 2015-05-19 DIAGNOSIS — R519 Headache, unspecified: Secondary | ICD-10-CM

## 2015-05-19 DIAGNOSIS — R51 Headache: Secondary | ICD-10-CM | POA: Diagnosis not present

## 2015-05-19 DIAGNOSIS — R52 Pain, unspecified: Secondary | ICD-10-CM

## 2015-05-19 DIAGNOSIS — J00 Acute nasopharyngitis [common cold]: Secondary | ICD-10-CM | POA: Diagnosis not present

## 2015-05-19 DIAGNOSIS — R109 Unspecified abdominal pain: Secondary | ICD-10-CM | POA: Diagnosis not present

## 2015-05-19 DIAGNOSIS — R413 Other amnesia: Secondary | ICD-10-CM | POA: Diagnosis not present

## 2015-05-24 DIAGNOSIS — R1012 Left upper quadrant pain: Secondary | ICD-10-CM | POA: Diagnosis not present

## 2015-05-24 DIAGNOSIS — R1032 Left lower quadrant pain: Secondary | ICD-10-CM | POA: Diagnosis not present

## 2015-05-24 DIAGNOSIS — Z86010 Personal history of colonic polyps: Secondary | ICD-10-CM | POA: Diagnosis not present

## 2015-05-24 DIAGNOSIS — K625 Hemorrhage of anus and rectum: Secondary | ICD-10-CM | POA: Diagnosis not present

## 2015-05-24 DIAGNOSIS — R12 Heartburn: Secondary | ICD-10-CM | POA: Diagnosis not present

## 2015-05-24 DIAGNOSIS — R140 Abdominal distension (gaseous): Secondary | ICD-10-CM | POA: Diagnosis not present

## 2015-05-24 DIAGNOSIS — Z800 Family history of malignant neoplasm of digestive organs: Secondary | ICD-10-CM | POA: Diagnosis not present

## 2015-05-24 DIAGNOSIS — K648 Other hemorrhoids: Secondary | ICD-10-CM | POA: Diagnosis not present

## 2015-05-28 ENCOUNTER — Ambulatory Visit
Admission: RE | Admit: 2015-05-28 | Discharge: 2015-05-28 | Disposition: A | Discharge status: Home / Self Care | Payer: Medicare Other | Source: Ambulatory Visit | Attending: Internal Medicine | Admitting: Internal Medicine

## 2015-05-28 DIAGNOSIS — R519 Headache, unspecified: Secondary | ICD-10-CM

## 2015-05-28 DIAGNOSIS — R52 Pain, unspecified: Secondary | ICD-10-CM

## 2015-05-28 DIAGNOSIS — R51 Headache: Secondary | ICD-10-CM | POA: Diagnosis not present

## 2015-06-30 DIAGNOSIS — Z1389 Encounter for screening for other disorder: Secondary | ICD-10-CM | POA: Diagnosis not present

## 2015-06-30 DIAGNOSIS — E785 Hyperlipidemia, unspecified: Secondary | ICD-10-CM | POA: Diagnosis not present

## 2015-06-30 DIAGNOSIS — Z79899 Other long term (current) drug therapy: Secondary | ICD-10-CM | POA: Diagnosis not present

## 2015-06-30 DIAGNOSIS — Z0000 Encounter for general adult medical examination without abnormal findings: Secondary | ICD-10-CM | POA: Diagnosis not present

## 2015-09-02 DIAGNOSIS — J0110 Acute frontal sinusitis, unspecified: Secondary | ICD-10-CM | POA: Diagnosis not present

## 2015-10-01 DIAGNOSIS — M9905 Segmental and somatic dysfunction of pelvic region: Secondary | ICD-10-CM | POA: Diagnosis not present

## 2015-10-01 DIAGNOSIS — M5136 Other intervertebral disc degeneration, lumbar region: Secondary | ICD-10-CM | POA: Diagnosis not present

## 2015-10-01 DIAGNOSIS — M9904 Segmental and somatic dysfunction of sacral region: Secondary | ICD-10-CM | POA: Diagnosis not present

## 2015-10-01 DIAGNOSIS — M9903 Segmental and somatic dysfunction of lumbar region: Secondary | ICD-10-CM | POA: Diagnosis not present

## 2015-10-04 DIAGNOSIS — M5136 Other intervertebral disc degeneration, lumbar region: Secondary | ICD-10-CM | POA: Diagnosis not present

## 2015-10-04 DIAGNOSIS — M9905 Segmental and somatic dysfunction of pelvic region: Secondary | ICD-10-CM | POA: Diagnosis not present

## 2015-10-04 DIAGNOSIS — M9903 Segmental and somatic dysfunction of lumbar region: Secondary | ICD-10-CM | POA: Diagnosis not present

## 2015-10-04 DIAGNOSIS — M9904 Segmental and somatic dysfunction of sacral region: Secondary | ICD-10-CM | POA: Diagnosis not present

## 2015-12-16 ENCOUNTER — Other Ambulatory Visit: Payer: Self-pay

## 2015-12-31 DIAGNOSIS — I700 Atherosclerosis of aorta: Secondary | ICD-10-CM | POA: Diagnosis not present

## 2015-12-31 DIAGNOSIS — R413 Other amnesia: Secondary | ICD-10-CM | POA: Diagnosis not present

## 2015-12-31 DIAGNOSIS — E559 Vitamin D deficiency, unspecified: Secondary | ICD-10-CM | POA: Diagnosis not present

## 2015-12-31 DIAGNOSIS — F329 Major depressive disorder, single episode, unspecified: Secondary | ICD-10-CM | POA: Diagnosis not present

## 2015-12-31 DIAGNOSIS — M2550 Pain in unspecified joint: Secondary | ICD-10-CM | POA: Diagnosis not present

## 2015-12-31 DIAGNOSIS — R252 Cramp and spasm: Secondary | ICD-10-CM | POA: Diagnosis not present

## 2015-12-31 DIAGNOSIS — E785 Hyperlipidemia, unspecified: Secondary | ICD-10-CM | POA: Diagnosis not present

## 2016-01-28 DIAGNOSIS — F329 Major depressive disorder, single episode, unspecified: Secondary | ICD-10-CM | POA: Diagnosis not present

## 2016-01-28 DIAGNOSIS — M2550 Pain in unspecified joint: Secondary | ICD-10-CM | POA: Diagnosis not present

## 2016-01-28 DIAGNOSIS — E785 Hyperlipidemia, unspecified: Secondary | ICD-10-CM | POA: Diagnosis not present

## 2016-01-28 DIAGNOSIS — Z23 Encounter for immunization: Secondary | ICD-10-CM | POA: Diagnosis not present

## 2016-01-28 DIAGNOSIS — E039 Hypothyroidism, unspecified: Secondary | ICD-10-CM | POA: Diagnosis not present

## 2016-01-28 DIAGNOSIS — R413 Other amnesia: Secondary | ICD-10-CM | POA: Diagnosis not present

## 2016-02-01 DIAGNOSIS — L821 Other seborrheic keratosis: Secondary | ICD-10-CM | POA: Diagnosis not present

## 2016-02-01 DIAGNOSIS — L570 Actinic keratosis: Secondary | ICD-10-CM | POA: Diagnosis not present

## 2016-02-01 DIAGNOSIS — L820 Inflamed seborrheic keratosis: Secondary | ICD-10-CM | POA: Diagnosis not present

## 2016-02-01 DIAGNOSIS — I780 Hereditary hemorrhagic telangiectasia: Secondary | ICD-10-CM | POA: Diagnosis not present

## 2016-02-01 DIAGNOSIS — L814 Other melanin hyperpigmentation: Secondary | ICD-10-CM | POA: Diagnosis not present

## 2016-02-01 DIAGNOSIS — L918 Other hypertrophic disorders of the skin: Secondary | ICD-10-CM | POA: Diagnosis not present

## 2016-02-01 DIAGNOSIS — D225 Melanocytic nevi of trunk: Secondary | ICD-10-CM | POA: Diagnosis not present

## 2016-02-01 DIAGNOSIS — D1801 Hemangioma of skin and subcutaneous tissue: Secondary | ICD-10-CM | POA: Diagnosis not present

## 2016-02-22 DIAGNOSIS — R51 Headache: Secondary | ICD-10-CM | POA: Diagnosis not present

## 2016-02-22 DIAGNOSIS — R413 Other amnesia: Secondary | ICD-10-CM | POA: Diagnosis not present

## 2016-03-16 ENCOUNTER — Ambulatory Visit (INDEPENDENT_AMBULATORY_CARE_PROVIDER_SITE_OTHER): Payer: Medicare Other | Admitting: Neurology

## 2016-03-16 ENCOUNTER — Encounter: Payer: Self-pay | Admitting: Neurology

## 2016-03-16 DIAGNOSIS — R51 Headache: Secondary | ICD-10-CM

## 2016-03-16 DIAGNOSIS — F02818 Dementia in other diseases classified elsewhere, unspecified severity, with other behavioral disturbance: Secondary | ICD-10-CM

## 2016-03-16 DIAGNOSIS — G300 Alzheimer's disease with early onset: Secondary | ICD-10-CM

## 2016-03-16 DIAGNOSIS — F0281 Dementia in other diseases classified elsewhere with behavioral disturbance: Secondary | ICD-10-CM | POA: Diagnosis not present

## 2016-03-16 DIAGNOSIS — G309 Alzheimer's disease, unspecified: Secondary | ICD-10-CM

## 2016-03-16 DIAGNOSIS — R519 Headache, unspecified: Secondary | ICD-10-CM

## 2016-03-16 DIAGNOSIS — F0280 Dementia in other diseases classified elsewhere without behavioral disturbance: Secondary | ICD-10-CM | POA: Insufficient documentation

## 2016-03-16 MED ORDER — DIVALPROEX SODIUM ER 500 MG PO TB24: 500.0000 mg | ORAL_TABLET | Freq: Every day | ORAL | 3 refills | Status: DC

## 2016-03-16 NOTE — Patient Instructions (Addendum)
I had a long discussion with the patient and her daughter regarding her headaches which seem like tension headaches. I recommend she do regular neck stretching exercises as well as tried Depakote ER 500 mg daily for headache prophylaxis. Check MRI scan of the brain with and without contrast, dementia panel,ESR, complete metabolic panel and CBC. Continue Aricept for her mild dementia which appears to have responded to it. The patient may also possibly consider participation in the [place] early dementia trial if interested. They will be given written information to review at home and decide. Return for follow-up in 2 months or call earlier if necessary.   Tension Headache A tension headache is a feeling of pain, pressure, or aching that is often felt over the front and sides of the head. The pain can be dull, or it can feel tight (constricting). Tension headaches are not normally associated with nausea or vomiting, and they do not get worse with physical activity. Tension headaches can last from 30 minutes to several days. This is the most common type of headache. CAUSES The exact cause of this condition is not known. Tension headaches often begin after stress, anxiety, or depression. Other triggers may include:  Alcohol.  Too much caffeine, or caffeine withdrawal.  Respiratory infections, such as colds, flu, or sinus infections.  Dental problems or teeth clenching.  Fatigue.  Holding your head and neck in the same position for a long period of time, such as while using a computer.  Smoking. SYMPTOMS Symptoms of this condition include:  A feeling of pressure around the head.  Dull, aching head pain.  Pain felt over the front and sides of the head.  Tenderness in the muscles of the head, neck, and shoulders. DIAGNOSIS This condition may be diagnosed based on your symptoms and a physical exam. Tests may be done, such as a CT scan or an MRI of your head. These tests may be done if your  symptoms are severe or unusual. TREATMENT This condition may be treated with lifestyle changes and medicines to help relieve symptoms. HOME CARE INSTRUCTIONS Managing Pain   Take over-the-counter and prescription medicines only as told by your health care provider.  Lie down in a dark, quiet room when you have a headache.  If directed, apply ice to the head and neck area:  Put ice in a plastic bag.  Place a towel between your skin and the bag.  Leave the ice on for 20 minutes, 2-3 times per day.  Use a heating pad or a hot shower to apply heat to the head and neck area as told by your health care provider. Eating and Drinking   Eat meals on a regular schedule.  Limit alcohol use.  Decrease your caffeine intake, or stop using caffeine. General Instructions   Keep all follow-up visits as told by your health care provider. This is important.  Keep a headache journal to help find out what may trigger your headaches. For example, write down:  What you eat and drink.  How much sleep you get.  Any change to your diet or medicines.  Try massage or other relaxation techniques.  Limit stress.  Sit up straight, and avoid tensing your muscles.  Do not use tobacco products, including cigarettes, chewing tobacco, or e-cigarettes. If you need help quitting, ask your health care provider.  Exercise regularly as told by your health care provider.  Get 7-9 hours of sleep, or the amount recommended by your health care provider. [place]  CARE IF:  Your symptoms are not helped by medicine.  You have a headache that is different from what you normally experience.  You have nausea or you vomit.  You have a fever. SEEK IMMEDIATE MEDICAL CARE IF:  Your headache becomes severe.  You have repeated vomiting.  You have a stiff neck.  You have a loss of vision.  You have problems with speech.  You have pain in your eye or ear.  You have muscular weakness or loss of  muscle control.  You lose your balance or you have trouble walking.  You feel faint or you pass out.  You have confusion. This information is not intended to replace advice given to you by your health care provider. Make sure you discuss any questions you have with your health care provider. Document Released: 03/27/2005 Document Revised: 12/16/2014 Document Reviewed: 07/20/2014 Elsevier Interactive Patient Education  2017 [employer].

## 2016-03-16 NOTE — Progress Notes (Signed)
[facility] [address] [place]. [place] [address] [phone]       OFFICE CONSULT NOTE  Sally. TONIMARIE GRITZ Date of Birth:  01-Mar-1941 Medical Record Number:  [id number]   Referring MD:  Dr. Arman Filter Reason for Referral:  Headache and dementia  HPI: Sally Dunlap is a pleasant 75 year old Caucasian lady who is accompanied today by her daughter who provides most of the history. She has been complaining of intermittent headaches  Since last 1 month and she has a spot on a vertex which she points to . This also involves the back of her as well as behind the right eye. She describes a dull pressure sensation which is now constant. At times this is disabling enough. She has tried migraine Excedrin and Advil cold and sinus pills which seemed to help somewhat. At times she describes a tight sensation which is annoying. She denies any prior history of migraines or similar headaches.She has had a few recent minor head injuries when she has hit the top of her head by the car door.The patient has not had any brain scanlast 1 year atleast,. She also has noted memory decline cognitive difficulties since the llast 1 yeari These seem to be more prominent  n recent months.She often ask the same questions again and has difficulty in retaining new information. She has good days and bad days.  Her primary physician has started her on Aricept 5 mg daily 2 months ago and she seems to be doing better She is tolerating it well without any side effects. It is unclear if she had any workup for reversible causes of memory loss  or not.there is no family history of Alzheimer's. She has no prior history of strokes, TIA, seizures or head injury with loss of consciousness.  ROS:   14 system review of systems is positive for hearing loss, eye pain, feeling hot, joint pain, memory loss, confusion, headache and all other systems negative.  PMH:  Past Medical History:  Diagnosis Date  .  Diverticulitis   . Hearing loss    hearing aidees  . Thyroid disease     Social History:  Social History   Social History  . Marital status: Single    Spouse name: N/A  . Number of children: N/A  . Years of education: N/A   Occupational History  . Not on file.   Social History Main Topics  . Smoking status: Never Smoker  . Smokeless tobacco: Never Used  . Alcohol use No  . Drug use: No  . Sexual activity: Not on file   Other Topics Concern  . Not on file   Social History Narrative  . No narrative on file    Medications:   Current Outpatient Prescriptions on File Prior to Visit  Medication Sig Dispense Refill  . levothyroxine (SYNTHROID, LEVOTHROID) 200 MCG tablet Take 200 mcg by mouth daily before breakfast.    . Multiple Vitamins-Minerals (MULTIVITAMIN & MINERAL PO) Take 1 tablet by mouth daily.     No current facility-administered medications on file prior to visit.     Allergies:  No Known Allergies  Physical Exam General: well developed, well nourished elderly Caucasian lady., seated, in no evident distress Head: head normocephalic and atraumatic.   Neck: supple with no carotid or supraclavicular bruits Cardiovascular: regular rate and rhythm, no murmurs Musculoskeletal: no deformity Skin:  no rash/petichiae Vascular:  Normal pulses all extremities  Neurologic Exam Mental Status: Awake and fully alert. Oriented  to place and time. Recent and remote memory intact. Attention span, concentration and fund of knowledge . Recall 2/3. Able to name only 7 animals with 4 legs.  Clock drawing 3/4. He. Mood and affect appropriate  sation intact. Face, tongue, palate moves normally and symmetrically.  Motor: Normal bulk and tone. Normal strength in all tested extremity muscles. Sensory.: intact to touch , pinprick , position and vibratory sensation.  Coordination: Rapid alternating movements normal in all extremities. Finger-to-nose and heel-to-shin performed  accurately bilaterally. Gait and Station: Arises from chair without difficulty. Stance is normal. Gait demonstrates normal stride length and balance . Able to heel, toe and tandem walk with  difficulty.  Reflexes: 1+ and symmetric. Toes downgoing.       ASSESSMENT: 75 year old lady with intermittent headache and eye and neck pain likely due to tension headache. 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 I recommend she do regular neck stretching exercises as well as tried Depakote ER 500 mg daily for headache prophylaxis. Check MRI scan of the brain with and without contrast, dementia panel,ESR, complete metabolic panel and CBC. Continue Aricept for her mild dementia which appears to have responded to it. The patient may also possibly consider participation in the [place] early dementia trial if interested. They will be given written information to review at home and decide. Greater than 50% time during this 45 minute consultation visit was spent on counseling and coordination of care about headaches, memory loss and answering questions at the.Return for follow-up in 2 months or call earlier if necessary. Antony Contras, MD  [facility] [address] [place], [place] [address]  Phone [phone] Fax [phone] Note: This document was prepared with digital dictation and possible smart phrase technology. Any transcriptional errors that result from this process are unintentional.

## 2016-03-17 ENCOUNTER — Other Ambulatory Visit: Payer: Self-pay

## 2016-03-17 LAB — CBC
HEMATOCRIT: 37.4 % (ref 34.0–46.6)
Hemoglobin: 12.6 g/dL (ref 11.1–15.9)
MCH: 30.1 pg (ref 26.6–33.0)
MCHC: 33.7 g/dL (ref 31.5–35.7)
MCV: 89 fL (ref 79–97)
Platelets: 261 10*3/uL (ref 150–379)
RBC: 4.19 x10E6/uL (ref 3.77–5.28)
RDW: 14.5 % (ref 12.3–15.4)
WBC: 5.7 10*3/uL (ref 3.4–10.8)

## 2016-03-17 LAB — COMPREHENSIVE METABOLIC PANEL
ALBUMIN: 4.5 g/dL (ref 3.5–4.8)
ALK PHOS: 69 IU/L (ref 39–117)
ALT: 23 IU/L (ref 0–32)
AST: 27 IU/L (ref 0–40)
Albumin/Globulin Ratio: 2 (ref 1.2–2.2)
BUN / CREAT RATIO: 18 (ref 12–28)
BUN: 15 mg/dL (ref 8–27)
Bilirubin Total: 0.3 mg/dL (ref 0.0–1.2)
CHLORIDE: 102 mmol/L (ref 96–106)
CO2: 26 mmol/L (ref 18–29)
Calcium: 9.6 mg/dL (ref 8.7–10.3)
Creatinine, Ser: 0.82 mg/dL (ref 0.57–1.00)
GFR calc Af Amer: 81 mL/min/{1.73_m2} (ref >59)
GFR calc non Af Amer: 70 mL/min/{1.73_m2} (ref >59)
GLUCOSE: 95 mg/dL (ref 65–99)
Globulin, Total: 2.2 g/dL (ref 1.5–4.5)
Potassium: 5.3 mmol/L — ABNORMAL HIGH (ref 3.5–5.2)
Sodium: 141 mmol/L (ref 134–144)
Total Protein: 6.7 g/dL (ref 6.0–8.5)

## 2016-03-17 LAB — DEMENTIA PANEL
HOMOCYSTEINE: 10.9 umol/L (ref 0.0–15.0)
RPR: NONREACTIVE
TSH: 1.04 u[IU]/mL (ref 0.450–4.500)
Vitamin B-12: 475 pg/mL (ref 232–1245)

## 2016-03-17 LAB — SEDIMENTATION RATE: Sed Rate: 2 mm/hr (ref 0–40)

## 2016-03-17 MED ORDER — DIVALPROEX SODIUM ER 500 MG PO TB24: 500.0000 mg | ORAL_TABLET | Freq: Every day | ORAL | 3 refills | Status: DC

## 2016-03-27 ENCOUNTER — Telehealth: Payer: Self-pay

## 2016-03-27 ENCOUNTER — Ambulatory Visit
Admission: RE | Admit: 2016-03-27 | Discharge: 2016-03-27 | Disposition: A | Discharge status: Home / Self Care | Payer: Medicare Other | Source: Ambulatory Visit | Attending: Neurology | Admitting: Neurology

## 2016-03-27 DIAGNOSIS — R51 Headache: Secondary | ICD-10-CM | POA: Diagnosis not present

## 2016-03-27 DIAGNOSIS — F0281 Dementia in other diseases classified elsewhere with behavioral disturbance: Secondary | ICD-10-CM | POA: Diagnosis not present

## 2016-03-27 DIAGNOSIS — G301 Alzheimer's disease with late onset: Secondary | ICD-10-CM | POA: Diagnosis not present

## 2016-03-27 DIAGNOSIS — R519 Headache, unspecified: Secondary | ICD-10-CM

## 2016-03-27 DIAGNOSIS — G300 Alzheimer's disease with early onset: Secondary | ICD-10-CM

## 2016-03-27 MED ORDER — GADOBENATE DIMEGLUMINE 529 MG/ML IV SOLN
14.0000 mL | Freq: Once | INTRAVENOUS | Status: AC | PRN
  Administered 2016-03-27: 14 mL via INTRAVENOUS

## 2016-03-27 NOTE — Telephone Encounter (Signed)
-----   Message from Garvin Fila, MD sent at 03/26/2016  3:26 PM EST ----- Kindly inform patient that lab work for reversible causes of memory loss was normal. CBC and basic chemistries were also fine except for slightly elevated potassium level. I recommend she see her primary physician for elevated potassium of

## 2016-03-27 NOTE — Telephone Encounter (Signed)
Rn call Sally Dunlap pts daughter on dpr about her moms lab work. Rn stated the  lab work for reversible causes of memory loss was normal. CBC and basic chemistries were also fine except for slightly elevated potassium level. I recommend she see her primary physician for elevated potassium. Patients daughter verbalized understanding. Rn stated a copy will be sent to PCP.  Patients daughter stated her mom has not started the Depakote yet. She stated it will be a delay with the depakote. She wanted to observe her mom while taking the medication for any side effects.Rn stated Dr. Leonie Man will be out of the office this week but he will be sent the message.

## 2016-03-28 NOTE — Telephone Encounter (Signed)
Rn call patiens PCP at [phone]. The PCP is Dr. Vertis Kelch. Rn was given the fax number of [phone],and [phone]. Rn fax lab work to both numbers. Pts daughter was call on 03/27/2016 about needing to contact PCP about pt potassium being 5.3. Both faxs were receive and confirm at both numbers.

## 2016-05-23 ENCOUNTER — Ambulatory Visit (INDEPENDENT_AMBULATORY_CARE_PROVIDER_SITE_OTHER): Payer: Medicare Other | Admitting: Neurology

## 2016-05-23 ENCOUNTER — Encounter: Payer: Self-pay | Admitting: Neurology

## 2016-05-23 VITALS — BP 105/73 | HR 60 | Wt 158.6 lb

## 2016-05-23 DIAGNOSIS — G44209 Tension-type headache, unspecified, not intractable: Secondary | ICD-10-CM

## 2016-05-23 DIAGNOSIS — I680 Cerebral amyloid angiopathy: Secondary | ICD-10-CM | POA: Diagnosis not present

## 2016-05-23 MED ORDER — DONEPEZIL HCL 10 MG PO TABS: 10.0000 mg | ORAL_TABLET | Freq: Every day | ORAL | 3 refills | Status: AC

## 2016-05-23 MED ORDER — DIVALPROEX SODIUM ER 500 MG PO TB24
500.0000 mg | ORAL_TABLET | Freq: Two times a day (BID) | ORAL | 3 refills | Status: DC
Start: 2016-05-23 — End: 2018-02-26

## 2016-05-23 NOTE — Patient Instructions (Addendum)
I had a long discussion the patient and her daughter regarding her headaches which appear to have improved on Depakote but are still bothersome and I recommend increasing dose of Depakote ER to 500 mg twice daily. I also encouraged her to do regular neck stretching exercises and participate in activities for stress relaxation. Also increase Aricept dose to 10 mg daily to help with her mild dementia and memory loss. I discussed possible side effects with the patient and daughter and advised him to call me if needed. She will return for follow-up in 3 months with my nurse practitioner or call earlier if necessary

## 2016-05-23 NOTE — Progress Notes (Signed)
[facility] [address] [place]. [place] [address] [phone]) [place] [address] W[address] M[place] Date of Birth:  1940-05-16 Medical Record Number:  [id number]   Referring MD:  Dr. Shelbie Proctor Reason for Referral:  Headache and dementia  [id number] consult 03/16/2016 ;  Ms Rodriquez is a pleasant 76 year old Caucasian lady who is accompanied today by her daughter who provides most of the history. She has been complaining of intermittent headaches  Since last 1 month and she has a spot on a vertex which she points to . This also involves the back of her as well as behind the right eye. She describes a dull pressure sensation which is now constant. At times this is disabling enough. She has tried migraine Excedrin and Advil cold and sinus pills which seemed to help somewhat. At times she describes a tight sensation which is annoying. She denies any prior history of migraines or similar headaches.She has had a few recent minor head injuries when she has hit the top of her head by the car door.The patient has not had any brain scanlast 1 year atleast,. She also has noted memory decline cognitive difficulties since the llast 1 yeari These seem to be more prominent  n recent months.She often ask the same questions again and has difficulty in retaining new information. She has good days and bad days.  Her primary physician has started her on Aricept 5 mg daily 2 months ago and she seems to be doing better She is tolerating it well without any side effects. It is unclear if she had any workup for reversible causes of memory loss  or not.there is no family history of Alzheimer's. She has no prior history of strokes, TIA, seizures or head injury with loss of consciousness. Update 05/23/2016 : She returns for follow-up initial consultation to months ago. She is accompanied by her daughter. Patient has noted improvement in her headaches after starting  Depakote headaches are much milder and occurring only once a week or so. She is tolerating Depakote well without any significant nausea, upset stomach, dizziness or tremors. The patient continues to have memory difficulties and intermittent confusion. She often repeats herself the mass questions over and over again. She tends to forget her keys a lot. 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 There is moderate degree of generalized cerebral atrophy. Patient has been doing some neck exercises but not on a regular basis. She has been on Aricept 5 mg daily for a long time the daughter is not aware if she has tried a higher dose yet. ROS:   14 system review of systems is positive for cold intolerance, muscle cramps, hearing loss, headache, memory loss , confusion,   and all other systems negative.  PMH:  Past Medical History:  Diagnosis Date  . Diverticulitis   . Headache   . Hearing loss    hearing aidees  . Thyroid disease     Social History:  Social History   Social History  . Marital status: Single    Spouse name: N/A  . Number of children: N/A  . Years of education: N/A   Occupational History  . Not on file.   Social History Main Topics  . Smoking status: Never Smoker  . Smokeless  tobacco: Never Used  . Alcohol use No  . Drug use: No  . Sexual activity: Not on file   Other Topics Concern  . Not on file   Social History Narrative  . No narrative on file    Medications:   Current Outpatient Prescriptions on File Prior to Visit  Medication Sig Dispense Refill  . aspirin EC 81 MG tablet Take by mouth.    Marland Kitchen atorvastatin (LIPITOR) 10 MG tablet Take by mouth.     . Cholecalciferol (D3 ADULT PO) Take 5,000 Units by mouth.    . [employer] COENZYME Q-10 100 MG capsule TAKE ONE CAPSULE BY MOUTH ONCE DAILY WITH A  MEAL  1  . escitalopram (LEXAPRO) 5 MG tablet     . levothyroxine (SYNTHROID, LEVOTHROID) 200 MCG tablet Take 200 mcg by mouth daily before breakfast.    . Multiple Vitamins-Minerals (CENTRUM SILVER PO) Take by mouth.    . Multiple Vitamins-Minerals (MULTIVITAMIN & MINERAL PO) Take 1 tablet by mouth daily.     No current facility-administered medications on file prior to visit.     Allergies:  No Known Allergies  Physical Exam General: well developed, well nourished elderly Caucasian lady., seated, in no evident distress Head: head normocephalic and atraumatic.   Neck: supple with no carotid or supraclavicular bruits Cardiovascular: regular rate and rhythm, no murmurs Musculoskeletal: no deformity Skin:  no rash/petichiae Vascular:  Normal pulses all extremities  Neurologic Exam Mental Status: Awake and fully alert. Oriented to place and time. Recent and remote memory intact. Attention span, concentration and fund of knowledge . Recall 2/3. Able to name only 3 animals with 4 legs.  Clock drawing 3/4. He. Mood and affect appropriate  sation intact. Face, tongue, palate moves normally and symmetrically.  Motor: Normal bulk and tone. Normal strength in all tested extremity muscles. Sensory.: intact to touch , pinprick , position and vibratory sensation.  Coordination: Rapid alternating movements normal in all extremities. Finger-to-nose and heel-to-shin performed accurately bilaterally. Gait and Station: Arises from chair without difficulty. Stance is normal. Gait demonstrates normal stride length and balance . Able to heel, toe and tandem walk with  difficulty.  Reflexes: 1+ and symmetric. Toes downgoing.       ASSESSMENT: 76 year old lady with intermittent headache and eye and neck pain likely due to tension headache. 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 I also encouraged her to do regular neck stretching exercises and participate in activities for stress relaxation. Also increase Aricept dose to 10 mg daily to help with her mild dementia and memory loss. I discussed possible side effects with the patient and daughter and advised him to call me if needed. Greater than 50% time during this 25 minute visit was spent on counseling and coordination of care about her tension headaches and memory loss from amyloid angiopathy and answering questions She will return for follow-up in 3 months with my nurse practitioner or call earlier if necessary Antony Contras, MD  [facility] [address] [place], [place] [address]  Phone [phone] Fax [phone] Note: This document was prepared with digital dictation and possible smart phrase technology. Any transcriptional errors that result from this process are unintentional.

## 2016-06-14 DIAGNOSIS — F329 Major depressive disorder, single episode, unspecified: Secondary | ICD-10-CM | POA: Diagnosis not present

## 2016-06-14 DIAGNOSIS — R51 Headache: Secondary | ICD-10-CM | POA: Diagnosis not present

## 2016-06-14 DIAGNOSIS — K2900 Acute gastritis without bleeding: Secondary | ICD-10-CM | POA: Diagnosis not present

## 2016-06-14 DIAGNOSIS — R413 Other amnesia: Secondary | ICD-10-CM | POA: Diagnosis not present

## 2016-07-03 ENCOUNTER — Telehealth: Payer: Self-pay | Admitting: Neurology

## 2016-07-03 NOTE — Telephone Encounter (Signed)
Patients daughter called and left message with me because she can't get through to [facility] phones.  Her mother has shock pains from her head to her chest area while sleeping.  She had them on one side last week but over the weekend the pains were on the other side.  Now her left eye doesn't feel right to the patient.  The daughter would like a call back to see if her mother can be seen this week.  She is very concerned.

## 2016-07-03 NOTE — Telephone Encounter (Signed)
I spoke to the patient's daughter who informed me that her tension headaches about the same but she is having some intermittent sharp shooting pains which wake her up at night. She seems to be tolerating Depakote in the current dose of 500 mg twice daily without side effects. I recommend we increase the dose of Depakote to 500 mg in the morning and 1000 mg at night. She was instructed to call me back in a few weeks to give me an update. She voiced understanding

## 2016-07-03 NOTE — Telephone Encounter (Signed)
Message sent to Dr.Sethi for review. 

## 2016-07-26 DIAGNOSIS — E785 Hyperlipidemia, unspecified: Secondary | ICD-10-CM | POA: Diagnosis not present

## 2016-07-26 DIAGNOSIS — R197 Diarrhea, unspecified: Secondary | ICD-10-CM | POA: Diagnosis not present

## 2016-07-26 DIAGNOSIS — F329 Major depressive disorder, single episode, unspecified: Secondary | ICD-10-CM | POA: Diagnosis not present

## 2016-07-26 DIAGNOSIS — I10 Essential (primary) hypertension: Secondary | ICD-10-CM | POA: Diagnosis not present

## 2016-07-26 DIAGNOSIS — F0281 Dementia in other diseases classified elsewhere with behavioral disturbance: Secondary | ICD-10-CM | POA: Diagnosis not present

## 2016-07-26 DIAGNOSIS — I700 Atherosclerosis of aorta: Secondary | ICD-10-CM | POA: Diagnosis not present

## 2016-07-26 DIAGNOSIS — E039 Hypothyroidism, unspecified: Secondary | ICD-10-CM | POA: Diagnosis not present

## 2016-07-26 DIAGNOSIS — R413 Other amnesia: Secondary | ICD-10-CM | POA: Diagnosis not present

## 2016-07-26 DIAGNOSIS — Z0000 Encounter for general adult medical examination without abnormal findings: Secondary | ICD-10-CM | POA: Diagnosis not present

## 2016-07-26 DIAGNOSIS — G301 Alzheimer's disease with late onset: Secondary | ICD-10-CM | POA: Diagnosis not present

## 2016-08-28 ENCOUNTER — Ambulatory Visit: Payer: Medicare Other | Admitting: Nurse Practitioner

## 2016-09-19 ENCOUNTER — Telehealth: Payer: Self-pay | Admitting: Vascular Surgery

## 2016-09-19 NOTE — Telephone Encounter (Signed)
Spoke to pt's daughter to sch appt 09/29/16; lab at 8:30 and MD at 9:45.

## 2016-09-19 NOTE — Telephone Encounter (Signed)
Rankin, Sally Haber, RN  [facility]  Phone Number: [phone]        Lahoma Crocker (daughter of patient) may be contacted at [phone] (alternate phone number [phone]) to schedule venous reflux exam and VV new patient evaluation. Patient is Sally Dunlap DOB [birth year]. She is experiencing leg swelling L>R, VV bilaterally, burning sensation lateral aspect of left calf, and bilateral leg pain especially when walking. Please schedule venous reflux study bilaterally and VV new patient appt. Thanks!

## 2016-09-20 ENCOUNTER — Other Ambulatory Visit: Payer: Self-pay | Admitting: *Deleted

## 2016-09-20 DIAGNOSIS — I83893 Varicose veins of bilateral lower extremities with other complications: Secondary | ICD-10-CM

## 2016-09-20 DIAGNOSIS — Z1231 Encounter for screening mammogram for malignant neoplasm of breast: Secondary | ICD-10-CM | POA: Diagnosis not present

## 2016-09-20 DIAGNOSIS — Z803 Family history of malignant neoplasm of breast: Secondary | ICD-10-CM | POA: Diagnosis not present

## 2016-09-21 ENCOUNTER — Other Ambulatory Visit: Payer: Self-pay

## 2016-09-21 DIAGNOSIS — I83893 Varicose veins of bilateral lower extremities with other complications: Secondary | ICD-10-CM

## 2016-09-29 ENCOUNTER — Encounter: Payer: Self-pay | Admitting: Neurology

## 2016-09-29 ENCOUNTER — Ambulatory Visit (HOSPITAL_COMMUNITY)
Admission: RE | Admit: 2016-09-29 | Discharge: 2016-09-29 | Disposition: A | Discharge status: Home / Self Care | Payer: Medicare Other | Source: Ambulatory Visit | Attending: Vascular Surgery | Admitting: Vascular Surgery

## 2016-09-29 ENCOUNTER — Ambulatory Visit (INDEPENDENT_AMBULATORY_CARE_PROVIDER_SITE_OTHER): Payer: Medicare Other | Admitting: Vascular Surgery

## 2016-09-29 ENCOUNTER — Encounter: Payer: Self-pay | Admitting: Vascular Surgery

## 2016-09-29 VITALS — BP 133/84 | HR 60 | Temp 98.0°F | Resp 14 | Ht 62.0 in | Wt 156.0 lb

## 2016-09-29 DIAGNOSIS — I83893 Varicose veins of bilateral lower extremities with other complications: Secondary | ICD-10-CM

## 2016-09-29 DIAGNOSIS — I83009 Varicose veins of unspecified lower extremity with ulcer of unspecified site: Secondary | ICD-10-CM

## 2016-09-29 DIAGNOSIS — L97909 Non-pressure chronic ulcer of unspecified part of unspecified lower leg with unspecified severity: Secondary | ICD-10-CM

## 2016-09-29 NOTE — Progress Notes (Signed)
Patient ID: Sally Dunlap, female   DOB: [birth year], 76 y.o.   MRN: [id number]  Reason for Consult: Varicose Veins (bilateral varicose  legs)   Referred by No ref. provider found  Subjective:     HPI:  Sally Dunlap is a 76 y.o. female presents for evaluation of bilateral lower extremity left greater than right pain with associated swelling. She states that she does have pain when walking but also occasionally has pain at rest. Pain is also at night mostly on the left lateral leg. She does have varicosities does not know when these started originally. She is a retired single female otherwise without issues. She does not have history of DVT does not take blood thinners. She does wear compression stockings with minimal relief.  Past Medical History:  Diagnosis Date  . Hyperlipidemia   . Hypertension   . Thyroid disease   . Varicose veins of both legs with edema    No family history on file. Past Surgical History:  Procedure Laterality Date  . CHOLECYSTECTOMY    . KNEE SURGERY Right   . OVARY SURGERY      Short Social History:  Social History  Substance Use Topics  . Smoking status: Never Smoker  . Smokeless tobacco: Never Used  . Alcohol use 0.6 oz/week    1 Glasses of wine per week     Comment: occaisionaly    No Known Allergies  Current Outpatient Prescriptions  Medication Sig Dispense Refill  . Atorvastatin Calcium (LIPITOR PO) Take 10 mg by mouth every other day.    . Coenzyme Q10 (COQ10 PO) Take 100 mg by mouth daily.    Marland Kitchen donepezil (ARICEPT) 10 MG tablet Take 10 mg by mouth at bedtime.    Marland Kitchen escitalopram (LEXAPRO) 10 MG tablet Take 10 mg by mouth daily.    Marland Kitchen levothyroxine (SYNTHROID, LEVOTHROID) 50 MCG tablet Take 50 mcg by mouth daily before breakfast.    . Multiple Vitamins-Minerals (CENTRUM SILVER PO) Take by mouth daily.    . ranitidine (ZANTAC) 150 MG capsule Take 150 mg by mouth daily.     No current facility-administered medications for this  visit.     Review of Systems  Constitutional:  Constitutional negative. HENT: HENT negative.  Eyes: Eyes negative.  Respiratory: Respiratory negative.  Cardiovascular: Positive for claudication and leg swelling.  GI: Gastrointestinal negative.  Musculoskeletal: Positive for leg pain and joint pain.  Neurological: Neurological negative. Hematologic: Hematologic/lymphatic negative.  Psychiatric: Psychiatric negative.        Objective:  Objective   Vitals:   09/29/16 1001  BP: 133/84  Pulse: 60  Resp: 14  Temp: 98 F (36.7 C)  TempSrc: Oral  SpO2: 98%  Weight: 156 lb (70.8 kg)  Height: 5\' 2"  (1.575 m)   Body mass index is 28.53 kg/m.  Physical Exam  Constitutional: She is oriented to person, place, and time. She appears well-developed.  HENT:  Head: Normocephalic.  Eyes: Pupils are equal, round, and reactive to light.  Neck: Normal range of motion.  Pulmonary/Chest: Effort normal.  Abdominal: Soft.  Musculoskeletal:  Bilateral lower extremity 1+ pitting edema, smaller varicosities throughout both lower legs.  Neurological: She is alert and oriented to person, place, and time.  Skin: Skin is warm and dry.  Psychiatric: She has a normal mood and affect. Her behavior is normal. Judgment and thought content normal.    Data: I've independently interpreted her bilateral lower extremity venous insufficiency studies she does have some common femoral vein  reflux but no greater saphenous or small saphenous venous reflux bilaterally.     Assessment/Plan:     76 year old female here for evaluation of varicose veins with pain that is aspirated with walking and also leg swelling. She does not have any major saphenous venous reflux on duplex today. She does have notable varicosities in her bilateral lower extremities but does not isolate her pain to these areas. We have offered her sclerotherapy with marginal chance of helping her. This time she wants to defer this management.  We discussed compression stockings and when to wear them as well as leg elevation. She assures good understanding. She can follow-up on a when necessary basis.      Waynetta Sandy MD [facility]

## 2016-10-02 ENCOUNTER — Telehealth: Payer: Self-pay | Admitting: Neurology

## 2016-10-02 ENCOUNTER — Other Ambulatory Visit: Payer: Self-pay

## 2016-10-02 MED ORDER — DONEPEZIL HCL 10 MG PO TABS: 10.0000 mg | ORAL_TABLET | Freq: Every day | ORAL | 3 refills | Status: DC

## 2016-10-02 NOTE — Telephone Encounter (Signed)
Sally Dunlap from [facility] calling for a new prescription for donepezil (ARICEPT) 10 MG tablet call [phone]

## 2016-10-02 NOTE — Telephone Encounter (Signed)
Refill done for aricept for 90 day supply with three [facility]

## 2016-10-10 ENCOUNTER — Other Ambulatory Visit: Payer: Self-pay | Admitting: Neurology

## 2016-10-25 DIAGNOSIS — M85859 Other specified disorders of bone density and structure, unspecified thigh: Secondary | ICD-10-CM | POA: Diagnosis not present

## 2016-10-25 DIAGNOSIS — I10 Essential (primary) hypertension: Secondary | ICD-10-CM | POA: Diagnosis not present

## 2016-10-25 DIAGNOSIS — F329 Major depressive disorder, single episode, unspecified: Secondary | ICD-10-CM | POA: Diagnosis not present

## 2016-10-25 DIAGNOSIS — F0281 Dementia in other diseases classified elsewhere with behavioral disturbance: Secondary | ICD-10-CM | POA: Diagnosis not present

## 2017-01-09 ENCOUNTER — Emergency Department: Payer: Medicare Other

## 2017-01-09 ENCOUNTER — Emergency Department
Admission: EM | Admit: 2017-01-09 | Discharge: 2017-01-09 | Disposition: A | Discharge status: Home / Self Care | Payer: Medicare Other | Attending: Emergency Medicine | Admitting: Emergency Medicine

## 2017-01-09 ENCOUNTER — Encounter: Payer: Self-pay | Admitting: *Deleted

## 2017-01-09 ENCOUNTER — Other Ambulatory Visit: Payer: Self-pay

## 2017-01-09 DIAGNOSIS — G309 Alzheimer's disease, unspecified: Secondary | ICD-10-CM | POA: Diagnosis not present

## 2017-01-09 DIAGNOSIS — M79602 Pain in left arm: Secondary | ICD-10-CM | POA: Diagnosis not present

## 2017-01-09 DIAGNOSIS — R202 Paresthesia of skin: Secondary | ICD-10-CM | POA: Insufficient documentation

## 2017-01-09 DIAGNOSIS — Z9049 Acquired absence of other specified parts of digestive tract: Secondary | ICD-10-CM | POA: Diagnosis not present

## 2017-01-09 DIAGNOSIS — M79603 Pain in arm, unspecified: Secondary | ICD-10-CM | POA: Insufficient documentation

## 2017-01-09 DIAGNOSIS — Z79899 Other long term (current) drug therapy: Secondary | ICD-10-CM | POA: Diagnosis not present

## 2017-01-09 DIAGNOSIS — R200 Anesthesia of skin: Secondary | ICD-10-CM

## 2017-01-09 DIAGNOSIS — R9431 Abnormal electrocardiogram [ECG] [EKG]: Secondary | ICD-10-CM | POA: Diagnosis not present

## 2017-01-09 DIAGNOSIS — R29898 Other symptoms and signs involving the musculoskeletal system: Secondary | ICD-10-CM

## 2017-01-09 DIAGNOSIS — R42 Dizziness and giddiness: Secondary | ICD-10-CM | POA: Diagnosis not present

## 2017-01-09 DIAGNOSIS — I10 Essential (primary) hypertension: Secondary | ICD-10-CM | POA: Diagnosis not present

## 2017-01-09 DIAGNOSIS — Z7982 Long term (current) use of aspirin: Secondary | ICD-10-CM | POA: Diagnosis not present

## 2017-01-09 LAB — BASIC METABOLIC PANEL
Anion gap: 9 (ref 5–15)
BUN: 15 mg/dL (ref 6–20)
CHLORIDE: 101 mmol/L (ref 101–111)
CO2: 27 mmol/L (ref 22–32)
CREATININE: 0.9 mg/dL (ref 0.44–1.00)
Calcium: 9.9 mg/dL (ref 8.9–10.3)
GFR calc Af Amer: >60 mL/min (ref >60)
GFR calc non Af Amer: >60 mL/min (ref >60)
GLUCOSE: 92 mg/dL (ref 65–99)
POTASSIUM: 3.6 mmol/L (ref 3.5–5.1)
SODIUM: 137 mmol/L (ref 135–145)

## 2017-01-09 LAB — CBC WITH DIFFERENTIAL/PLATELET
Basophils Absolute: 0 10*3/uL (ref 0–0.1)
Basophils Relative: 1 %
EOS PCT: 2 %
Eosinophils Absolute: 0.1 10*3/uL (ref 0–0.7)
HCT: 37.8 % (ref 35.0–47.0)
HEMOGLOBIN: 13.2 g/dL (ref 12.0–16.0)
LYMPHS ABS: 2.5 10*3/uL (ref 1.0–3.6)
LYMPHS PCT: 45 %
MCH: 31.3 pg (ref 26.0–34.0)
MCHC: 34.9 g/dL (ref 32.0–36.0)
MCV: 89.6 fL (ref 80.0–100.0)
MONO ABS: 0.7 10*3/uL (ref 0.2–0.9)
MONOS PCT: 12 %
Neutro Abs: 2.2 10*3/uL (ref 1.4–6.5)
Neutrophils Relative %: 40 %
PLATELETS: 206 10*3/uL (ref 150–440)
RBC: 4.22 MIL/uL (ref 3.80–5.20)
RDW: 13.9 % (ref 11.5–14.5)
WBC: 5.6 10*3/uL (ref 3.6–11.0)

## 2017-01-09 LAB — TROPONIN I: Troponin I: <0.03 ng/mL (ref <0.03)

## 2017-01-09 NOTE — Discharge Instructions (Addendum)
Return to the ER for new or worsening numbness, heaviness, weakness, confusion or change in mental status, headache, chest pain, difficulty breathing, or any other new or worsening symptoms that concern you.  Arrange a follow-up with a primary care doctor within the next 1-2 weeks.  The X ray showed a nodule in the left upper lung.  This should be followed up with a CT scan of the chest in the next several weeks.

## 2017-01-09 NOTE — ED Triage Notes (Signed)
Pt ambulatory to triage.  Pt reports numbness in left side of face for 2 hours.  Pt also has pain and numbness in left arm.  No chest pain.  Pt reports dizziness and sob.  Pt alert   Speech clear. Hx brain bleed

## 2017-01-09 NOTE — ED Notes (Signed)
MD at bedside. 

## 2017-01-09 NOTE — ED Provider Notes (Signed)
[facility] Emergency Department Provider Note ____________________________________________   First MD Initiated Contact with Patient 01/09/17 2037     (approximate)  I have reviewed the triage vital signs and the nursing notes.   HISTORY  Chief Complaint Numbness    HPI Sally Dunlap is a 76 y.o. female with a history of intracranial hemorrhage, dementia, and other PMH as noted below, who presents with numbness to the left side of her face, acute onset approximately 2 hours ago, associated with mild pain to left side of the face, and now resolved. Patient denies any prior history of this symptom. She also reports that her left arm started feeling heavy approximately 10 hours ago while she was walking, and there was some associated shortness of breath both of which are also now resolved. Patient denies any chest pain, palpitations, headache, weakness or numbness in any other parts of her body, or any recent illness.    Past Medical History:  Diagnosis Date  . Diverticulitis   . Headache   . Hearing loss    hearing aidees  . Hyperlipidemia   . Hypertension   . Thyroid disease   . Varicose veins of both legs with edema     Patient Active Problem List   Diagnosis Date Noted  . 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 Headache 03/16/2016  . Alzheimer's disease 03/16/2016    Past Surgical History:  Procedure Laterality Date  . CHOLECYSTECTOMY    . COLONOSCOPY W/ POLYPECTOMY    . KNEE ARTHROSCOPY     meniscus x 2 ; left  . KNEE SURGERY Right   . left ovary    . OVARY SURGERY      Prior to Admission medications   Medication Sig Start Date End Date Taking? Authorizing Provider  aspirin EC 81 MG tablet Take by mouth.    [provider]  atorvastatin (LIPITOR) 10 MG tablet Take by mouth.  05/05/15   [provider]  Atorvastatin Calcium (LIPITOR PO) Take 10 mg by mouth every other day.    [provider]    Cholecalciferol (D3 ADULT PO) Take 5,000 Units by mouth.    [provider]  Coenzyme Q10 (COQ10 PO) Take 100 mg by mouth daily.    [provider]  [employer] COENZYME Q-10 100 MG capsule TAKE ONE CAPSULE BY MOUTH ONCE DAILY WITH A MEAL 02/01/16   [provider]  divalproex (DEPAKOTE ER) 500 MG 24 hr tablet Take 1 tablet (500 mg total) by mouth 2 (two) times daily. 05/23/16   Garvin Fila, MD  donepezil (ARICEPT) 10 MG tablet Take 1 tablet (10 mg total) by mouth at bedtime. 05/23/16   Garvin Fila, MD  donepezil (ARICEPT) 10 MG tablet Take 1 tablet (10 mg total) by mouth at bedtime. 10/02/16   Garvin Fila, MD  escitalopram (LEXAPRO) 10 MG tablet Take 10 mg by mouth daily.    [provider]  escitalopram (LEXAPRO) 5 MG tablet  02/22/16   [provider]  levothyroxine (SYNTHROID, LEVOTHROID) 200 MCG tablet Take 200 mcg by mouth daily before breakfast.    [provider]  levothyroxine (SYNTHROID, LEVOTHROID) 50 MCG tablet Take 50 mcg by mouth daily before breakfast.    [provider]  Multiple Vitamins-Minerals (CENTRUM SILVER PO) Take by mouth.    [provider]  Multiple Vitamins-Minerals (CENTRUM SILVER PO) Take by mouth daily.    [provider]  Multiple Vitamins-Minerals (MULTIVITAMIN &  MINERAL PO) Take 1 tablet by mouth daily.    [provider]  ranitidine (ZANTAC) 150 MG capsule Take 150 mg by mouth daily.    [provider]    Allergies Patient has no known allergies.  Family History  Problem Relation Age of Onset  . Colon cancer Mother   . Stroke Brother     Social History Social History  Substance Use Topics  . Smoking status: Never Smoker  . Smokeless tobacco: Never Used  . Alcohol use 0.6 oz/week    1 Glasses of wine per week     Comment: occaisionaly    Review of Systems  Constitutional: No fever. Eyes: No visual changes. ENT: No neck pain.  Cardiovascular:  Denies chest pain. Respiratory: Positive for resolved shortness of breath. Gastrointestinal: No nausea, no vomiting.   Genitourinary: Negative for dysuria.  Musculoskeletal: Negative for back pain. Skin: Negative for rash. Neurological: Negative for headaches, focal weakness or numbness.   ____________________________________________   PHYSICAL EXAM:  VITAL SIGNS: ED Triage Vitals  Enc Vitals Group     BP 01/09/17 2020 (!) 146/74     Pulse Rate 01/09/17 2020 (!) 56     Resp 01/09/17 2020 20     Temp 01/09/17 2020 98.4 F (36.9 C)     Temp Source 01/09/17 2020 Oral     SpO2 01/09/17 2020 97 %     Weight 01/09/17 2021 157 lb (71.2 kg)     Height 01/09/17 2021 5\' 4"  (1.626 m)     Head Circumference --      Peak Flow --      Pain Score --      Pain Loc --      Pain Edu? --      Excl. in [place]? --     Constitutional: Alert and oriented. Well appearing and in no acute distress. Eyes: Conjunctivae are normal. EOMI.  PERRLA.  Head: Atraumatic.  No facial swelling, tenderness, or rash.   Nose: No congestion/rhinnorhea. Mouth/Throat: Mucous membranes are moist.   Neck: Normal range of motion.  Cardiovascular: Normal rate, regular rhythm. Grossly normal heart sounds.  Good peripheral circulation. Respiratory: Normal respiratory effort.  No retractions. Lungs CTAB. Gastrointestinal: Soft and nontender. No distention.  Genitourinary: No CVA tenderness. Musculoskeletal: No lower extremity edema.  Extremities warm and well perfused.  Neurologic:  Normal speech and language. No gross focal neurologic deficits are appreciated. 5/5 motor strength and intact sensation to all extremities.  CNs III-XII intact, normal coordination.   Skin:  Skin is warm and dry. No rash noted. Psychiatric: Mood and affect are normal. Speech and behavior are normal.    ____________________________________________   LABS (all labs ordered are listed, but only abnormal results are displayed)  Labs Reviewed    BASIC METABOLIC PANEL  CBC WITH DIFFERENTIAL/PLATELET  TROPONIN I  URINALYSIS, COMPLETE (UACMP) WITH MICROSCOPIC   ____________________________________________  EKG  ED ECG REPORT I, Arta Silence, the attending physician, personally viewed and interpreted this ECG.  Date: 01/09/2017 EKG Time: 2033 Rate: 52 Rhythm: normal sinus rhythm QRS Axis: normal Intervals: normal ST/T Wave abnormalities: LVH, nonspecific ST abnormality Narrative Interpretation: no evidence of acute ischemia; no prior EKG to compare  ____________________________________________  RADIOLOGY  CT head: no acute findings CXR: nodules in L upper lobe, no other acute abnormalities  ____________________________________________   PROCEDURES  Procedure(s) performed: No    Critical Care performed: No ____________________________________________   INITIAL IMPRESSION / ASSESSMENT AND PLAN / ED COURSE  Pertinent labs & imaging results that were available during my care of the patient were reviewed by me and considered in my medical decision making (see chart for details).  76 year old female with past history as noted presents with left facial numbness acute onset for last 2 hours, and left arm heaviness and shortness of breath started about 10 hours ago. Patient states that both his symptoms have resolved. On exam, vital signs are normal, patient is well-appearing, and exam is otherwise unremarkable. There are no focal neuro exam findings. given patient's history of a scale hemorrhage she is at elevated risk for this, although since symptoms have resolved and the neuro exam is normal suspicion is low. Also low suspicion for TIA given the brief duration of symptoms, the focal location, and lack of CVA/TIA risk factors.  ABCD2 score is 3, so pt is low risk.  The left arm heaviness is also nonspecific, it is also resolved and exam is currently normal. EKG is nonischemic. Low suspicion for ACS on this acute  presentation. CT head, basic labs, troponin 1 given duration of symptoms for greater than 4 hours ago, UA, CXR, and reassess.  despite discharge home if no recurrent symptoms and workup is negative.    ----------------------------------------- 10:47 PM on 01/09/2017 -----------------------------------------  CT head is negative. Lab workup and chest x-ray are unremarkable (except for L upper lobe nodules).  Pt has had no further symptoms in the ER.  At this time, there is no evidence of TIA or other acute CNS cause, or ACS.  Patient feels well and would like to go home.  Her family member is comfortable with this plan.  Return precautions given.  Pt is working on getting a PMD (they just moved to the area) but family member states they will arrange for one and follow up ASAP.   ____________________________________________   FINAL CLINICAL IMPRESSION(S) / ED DIAGNOSES  Final diagnoses:  Left facial numbness  Arm heaviness      NEW MEDICATIONS STARTED DURING THIS VISIT:  New Prescriptions   No medications on file     Note:  This document was prepared using Dragon voice recognition software and may include unintentional dictation errors.     Arta Silence, MD 01/09/17 2249

## 2017-01-09 NOTE — ED Notes (Signed)
Patient transported to CT 

## 2017-01-15 DIAGNOSIS — Z23 Encounter for immunization: Secondary | ICD-10-CM | POA: Diagnosis not present

## 2017-04-27 DIAGNOSIS — E039 Hypothyroidism, unspecified: Secondary | ICD-10-CM | POA: Diagnosis not present

## 2017-04-27 DIAGNOSIS — F3341 Major depressive disorder, recurrent, in partial remission: Secondary | ICD-10-CM | POA: Diagnosis not present

## 2017-04-27 DIAGNOSIS — K219 Gastro-esophageal reflux disease without esophagitis: Secondary | ICD-10-CM | POA: Diagnosis not present

## 2017-04-27 DIAGNOSIS — R252 Cramp and spasm: Secondary | ICD-10-CM | POA: Diagnosis not present

## 2017-04-27 DIAGNOSIS — R413 Other amnesia: Secondary | ICD-10-CM | POA: Diagnosis not present

## 2017-04-27 DIAGNOSIS — E785 Hyperlipidemia, unspecified: Secondary | ICD-10-CM | POA: Diagnosis not present

## 2017-04-30 DIAGNOSIS — E785 Hyperlipidemia, unspecified: Secondary | ICD-10-CM | POA: Diagnosis not present

## 2017-04-30 DIAGNOSIS — E039 Hypothyroidism, unspecified: Secondary | ICD-10-CM | POA: Diagnosis not present

## 2017-05-10 ENCOUNTER — Other Ambulatory Visit: Payer: Self-pay | Admitting: Acute Care

## 2017-05-10 DIAGNOSIS — F0150 Vascular dementia without behavioral disturbance: Secondary | ICD-10-CM | POA: Diagnosis not present

## 2017-05-10 DIAGNOSIS — R51 Headache: Secondary | ICD-10-CM | POA: Diagnosis not present

## 2017-05-10 DIAGNOSIS — I619 Nontraumatic intracerebral hemorrhage, unspecified: Secondary | ICD-10-CM

## 2017-05-10 DIAGNOSIS — R930 Abnormal findings on diagnostic imaging of skull and head, not elsewhere classified: Secondary | ICD-10-CM | POA: Diagnosis not present

## 2017-05-10 DIAGNOSIS — G8929 Other chronic pain: Secondary | ICD-10-CM

## 2017-05-10 DIAGNOSIS — R413 Other amnesia: Secondary | ICD-10-CM | POA: Insufficient documentation

## 2017-05-10 DIAGNOSIS — M542 Cervicalgia: Secondary | ICD-10-CM | POA: Diagnosis not present

## 2017-05-11 ENCOUNTER — Ambulatory Visit
Admission: RE | Admit: 2017-05-11 | Discharge: 2017-05-11 | Disposition: A | Discharge status: Home / Self Care | Payer: Medicare Other | Source: Ambulatory Visit | Attending: Acute Care | Admitting: Acute Care

## 2017-05-11 DIAGNOSIS — I619 Nontraumatic intracerebral hemorrhage, unspecified: Secondary | ICD-10-CM | POA: Diagnosis not present

## 2017-05-11 DIAGNOSIS — Z8673 Personal history of transient ischemic attack (TIA), and cerebral infarction without residual deficits: Secondary | ICD-10-CM | POA: Insufficient documentation

## 2017-05-11 DIAGNOSIS — R51 Headache: Secondary | ICD-10-CM | POA: Insufficient documentation

## 2017-05-11 DIAGNOSIS — R930 Abnormal findings on diagnostic imaging of skull and head, not elsewhere classified: Secondary | ICD-10-CM | POA: Insufficient documentation

## 2017-05-11 DIAGNOSIS — R200 Anesthesia of skin: Secondary | ICD-10-CM | POA: Diagnosis not present

## 2017-06-02 ENCOUNTER — Other Ambulatory Visit: Payer: Self-pay

## 2017-06-02 ENCOUNTER — Ambulatory Visit
Admission: EM | Admit: 2017-06-02 | Discharge: 2017-06-02 | Disposition: A | Discharge status: Home / Self Care | Payer: Medicare Other

## 2017-06-02 DIAGNOSIS — R059 Cough, unspecified: Secondary | ICD-10-CM

## 2017-06-02 DIAGNOSIS — B9789 Other viral agents as the cause of diseases classified elsewhere: Secondary | ICD-10-CM | POA: Diagnosis not present

## 2017-06-02 DIAGNOSIS — J069 Acute upper respiratory infection, unspecified: Secondary | ICD-10-CM

## 2017-06-02 DIAGNOSIS — R05 Cough: Secondary | ICD-10-CM | POA: Diagnosis not present

## 2017-06-02 MED ORDER — FLUTICASONE PROPIONATE 50 MCG/ACT NA SUSP: 2.0000 | Freq: Every day | NASAL | 2 refills | Status: DC

## 2017-06-02 MED ORDER — PREDNISONE 20 MG PO TABS: 40.0000 mg | ORAL_TABLET | Freq: Every day | ORAL | 0 refills | Status: DC

## 2017-06-02 MED ORDER — HYDROCOD POLST-CPM POLST ER 10-8 MG/5ML PO SUER: 5.0000 mL | Freq: Every evening | ORAL | 0 refills | Status: DC | PRN

## 2017-06-02 MED ORDER — AZITHROMYCIN 250 MG PO TABS: 250.0000 mg | ORAL_TABLET | Freq: Every day | ORAL | 0 refills | Status: DC

## 2017-06-02 NOTE — Discharge Instructions (Signed)
Please continue with over-the-counter medications for daytime cough, congestion and runny nose.  For nighttime cough he may use Tussionex.  Please take prednisone as prescribed.  He may also use Flonase as needed for nasal congestion or runny nose.  If no improvement in symptoms in 3 days, you may start antibiotic.  Please make sure you are drinking lots of fluids.  Return to the clinic for any worsening cough, shortness of breath, difficulty breathing, fevers above 101 or any urgent changes in your health.

## 2017-06-02 NOTE — ED Provider Notes (Signed)
[facility]    CSN: [id number] Arrival date & time: 06/02/17  1211     History   Chief Complaint Chief Complaint  Patient presents with  . Cough    HPI Sally Dunlap is a 77 y.o. female presents today for evaluation of cough, congestion, runny nose, sore throat.  Symptoms have been present for 3-4 days.  Cough is dry during the day and at nighttime sometimes can be productive.  She denies any chest pain, shortness of breath.  No wheezing.  She has had low-grade fevers of 99.  She denies any headache, body aches.  She did have a mild sore throat but this is resolved.  She is tolerating p.o. well with no nausea or vomiting.  She states she has had a few days of diarrhea one episode each morning.  She denies any dizziness, lightheadedness.  She has been taking over-the-counter Mucinex DM  HPI  Past Medical History:  Diagnosis Date  . Diverticulitis   . Headache   . Hearing loss    hearing aidees  . Hyperlipidemia   . Hypertension   . Thyroid disease   . Varicose veins of both legs with edema     Patient Active Problem List   Diagnosis Date Noted  . 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 Headache 03/16/2016  . Alzheimer's disease 03/16/2016    Past Surgical History:  Procedure Laterality Date  . CHOLECYSTECTOMY    . COLONOSCOPY W/ POLYPECTOMY    . KNEE ARTHROSCOPY     meniscus x 2 ; left  . KNEE SURGERY Right   . left ovary    . OVARY SURGERY      OB History    No data available       Home Medications    Prior to Admission medications   Medication Sig Start Date End Date Taking? Authorizing Provider  Cholecalciferol (D3 ADULT PO) Take 5,000 Units by mouth.   Yes [provider]  Coenzyme Q10 (COQ10 PO) Take 100 mg by mouth daily.   Yes [provider]  donepezil (ARICEPT) 10 MG tablet Take 1 tablet (10 mg total) by mouth at bedtime. 05/23/16  Yes Garvin Fila, MD  escitalopram (LEXAPRO) 5 MG tablet   02/22/16  Yes [provider]  gabapentin (NEURONTIN) 600 MG tablet Take 600 mg by mouth 2 (two) times daily.   Yes [provider]  levothyroxine (SYNTHROID, LEVOTHROID) 200 MCG tablet Take 200 mcg by mouth daily before breakfast.   Yes [provider]  Multiple Vitamins-Minerals (CENTRUM SILVER PO) Take by mouth.   Yes [provider]  Multiple Vitamins-Minerals (MULTIVITAMIN & MINERAL PO) Take 1 tablet by mouth daily.   Yes [provider]  ranitidine (ZANTAC) 150 MG capsule Take 150 mg by mouth daily.   Yes [provider]  aspirin EC 81 MG tablet Take by mouth.    [provider]  atorvastatin (LIPITOR) 10 MG tablet Take by mouth.  05/05/15   [provider]  Atorvastatin Calcium (LIPITOR PO) Take 10 mg by mouth every other day.    [provider]  azithromycin (ZITHROMAX) 250 MG tablet Take 1 tablet (250 mg total) by mouth daily. Take first 2 tablets together, then 1 every day until finished. 06/02/17   Duanne Guess, PA-C  chlorpheniramine-HYDROcodone (TUSSIONEX PENNKINETIC ER) 10-8 MG/5ML SUER Take 5 mLs by mouth at bedtime as needed for cough. 06/02/17   Duanne Guess, PA-C  [facility] COENZYME  Q-10 100 MG capsule TAKE ONE CAPSULE BY MOUTH ONCE DAILY WITH A MEAL 02/01/16   [provider]  divalproex (DEPAKOTE ER) 500 MG 24 hr tablet Take 1 tablet (500 mg total) by mouth 2 (two) times daily. 05/23/16   Garvin Fila, MD  donepezil (ARICEPT) 10 MG tablet Take 1 tablet (10 mg total) by mouth at bedtime. 10/02/16   Garvin Fila, MD  escitalopram (LEXAPRO) 10 MG tablet Take 10 mg by mouth daily.    [provider]  fluticasone (FLONASE) 50 MCG/ACT nasal spray Place 2 sprays into both nostrils daily. 06/02/17   Duanne Guess, PA-C  levothyroxine (SYNTHROID, LEVOTHROID) 50 MCG tablet Take 50 mcg by mouth daily before breakfast.    [provider]  Multiple Vitamins-Minerals (CENTRUM SILVER  PO) Take by mouth daily.    [provider]  predniSONE (DELTASONE) 20 MG tablet Take 2 tablets (40 mg total) by mouth daily. 06/02/17   Duanne Guess, PA-C    Family History Family History  Problem Relation Age of Onset  . Colon cancer Mother   . Stroke Brother     Social History Social History   Tobacco Use  . Smoking status: Never Smoker  . Smokeless tobacco: Never Used  Substance Use Topics  . Alcohol use: Yes    Alcohol/week: 0.6 oz    Types: 1 Glasses of wine per week    Comment: occaisionaly  . Drug use: No     Allergies   Patient has no known allergies.   Review of Systems Review of Systems  Constitutional: Negative for fever.  HENT: Positive for congestion, rhinorrhea and sore throat. Negative for ear discharge, sinus pressure, sinus pain, trouble swallowing and voice change.   Respiratory: Positive for cough. Negative for shortness of breath, wheezing and stridor.   Cardiovascular: Negative for chest pain.  Gastrointestinal: Positive for diarrhea. Negative for abdominal pain, nausea and vomiting.  Genitourinary: Negative for dysuria, flank pain and pelvic pain.  Musculoskeletal: Negative for back pain and myalgias.  Skin: Negative for rash.  Neurological: Negative for dizziness and headaches.     Physical Exam Triage Vital Signs ED Triage Vitals  Enc Vitals Group     BP 06/02/17 1300 122/74     Pulse Rate 06/02/17 1300 69     Resp --      Temp 06/02/17 1300 99 F (37.2 C)     Temp Source 06/02/17 1300 Oral     SpO2 06/02/17 1300 96 %     Weight 06/02/17 1302 156 lb (70.8 kg)     Height 06/02/17 1302 5\' 2"  (1.575 m)     Head Circumference --      Peak Flow --      Pain Score --      Pain Loc --      Pain Edu? --      Excl. in [place]? --    No data found.  Updated Vital Signs BP 122/74 (BP Location: Left Arm)   Pulse 69   Temp 99 F (37.2 C) (Oral)   Ht 5\' 2"  (1.575 m)   Wt 156 lb (70.8 kg)   SpO2 96%   BMI 28.53 kg/m    Visual Acuity Right Eye Distance:   Left Eye Distance:   Bilateral Distance:    Right Eye Near:   Left Eye Near:    Bilateral Near:     Physical Exam  Constitutional: She is oriented to person, place, and  time. She appears well-developed and well-nourished. No distress.  HENT:  Head: Normocephalic and atraumatic.  Right Ear: Hearing, tympanic membrane, external ear and ear canal normal.  Left Ear: Hearing, tympanic membrane, external ear and ear canal normal.  Nose: Rhinorrhea present.  Mouth/Throat: Mucous membranes are normal. No trismus in the jaw. No uvula swelling. Posterior oropharyngeal erythema present. No oropharyngeal exudate, posterior oropharyngeal edema or tonsillar abscesses. No tonsillar exudate.  Eyes: Conjunctivae are normal. Right eye exhibits no discharge. Left eye exhibits no discharge.  Neck: Normal range of motion.  Cardiovascular: Normal rate and regular rhythm.  Pulmonary/Chest: Effort normal and breath sounds normal. No stridor. No respiratory distress. She has no wheezes. She has no rales. She exhibits no tenderness.  Abdominal: Soft. She exhibits no distension. There is no tenderness.  Musculoskeletal: Normal range of motion. She exhibits no deformity.  Lymphadenopathy:    She has no cervical adenopathy.  Neurological: She is alert and oriented to person, place, and time. She has normal reflexes.  Skin: Skin is warm and dry.  Psychiatric: She has a normal mood and affect. Her behavior is normal. Thought content normal.     UC Treatments / Results  Labs (all labs ordered are listed, but only abnormal results are displayed) Labs Reviewed - No data to display  EKG  EKG Interpretation None       Radiology No results found.  Procedures Procedures (including critical care time)  Medications Ordered in UC Medications - No data to display   Initial Impression / Assessment and Plan / UC Course  I have reviewed the triage vital signs and the  nursing notes.  Pertinent labs & imaging results that were available during my care of the patient were reviewed by me and considered in my medical decision making (see chart for details).     77 year old female with upper respiratory infection for 3-4 days.  Mostly dry cough but will have occasional production of cough.  No significant fevers although she has a low-grade fever 99 currently.  Symptoms did not seem to be consistent with fluid she is not having a lot of body aches and high fevers.  She is currently taking Mucinex DM, will continue with his medication.  I did prescribe Flonase, prednisone as well as Tussionex at nighttime for cough.  She is given a prescription for azithromycin and can start this medication if symptoms have shown no signs of improvement in 3 days.  Patient will continue to monitor herself for any fevers.  If any fevers, shortness of breath, worsening cough she will return.  Final Clinical Impressions(s) / UC Diagnoses   Final diagnoses:  Cough  Viral upper respiratory tract infection    ED Discharge Orders        Ordered    chlorpheniramine-HYDROcodone (TUSSIONEX PENNKINETIC ER) 10-8 MG/5ML SUER  At bedtime PRN     06/02/17 1338    predniSONE (DELTASONE) 20 MG tablet  Daily     06/02/17 1338    azithromycin (ZITHROMAX) 250 MG tablet  Daily     06/02/17 1338    fluticasone (FLONASE) 50 MCG/ACT nasal spray  Daily     06/02/17 1338        Duanne Guess, [place] 06/02/17 1345

## 2017-06-02 NOTE — ED Triage Notes (Signed)
Patient c/o cough, fever and diarrhea x 5 days.

## 2017-06-21 DIAGNOSIS — R51 Headache: Secondary | ICD-10-CM | POA: Diagnosis not present

## 2017-06-21 DIAGNOSIS — F0150 Vascular dementia without behavioral disturbance: Secondary | ICD-10-CM | POA: Diagnosis not present

## 2017-06-22 DIAGNOSIS — R51 Headache: Secondary | ICD-10-CM

## 2017-06-22 DIAGNOSIS — R519 Headache, unspecified: Secondary | ICD-10-CM | POA: Insufficient documentation

## 2017-09-10 ENCOUNTER — Other Ambulatory Visit: Payer: Self-pay | Admitting: Neurology

## 2017-09-26 DIAGNOSIS — R202 Paresthesia of skin: Secondary | ICD-10-CM | POA: Diagnosis not present

## 2017-09-26 DIAGNOSIS — M47816 Spondylosis without myelopathy or radiculopathy, lumbar region: Secondary | ICD-10-CM | POA: Diagnosis not present

## 2017-09-26 DIAGNOSIS — R300 Dysuria: Secondary | ICD-10-CM | POA: Diagnosis not present

## 2017-09-26 DIAGNOSIS — N390 Urinary tract infection, site not specified: Secondary | ICD-10-CM | POA: Diagnosis not present

## 2017-10-01 DIAGNOSIS — Z1231 Encounter for screening mammogram for malignant neoplasm of breast: Secondary | ICD-10-CM | POA: Diagnosis not present

## 2017-10-01 DIAGNOSIS — Z803 Family history of malignant neoplasm of breast: Secondary | ICD-10-CM | POA: Diagnosis not present

## 2017-11-02 DIAGNOSIS — R413 Other amnesia: Secondary | ICD-10-CM | POA: Diagnosis not present

## 2017-11-02 DIAGNOSIS — F3341 Major depressive disorder, recurrent, in partial remission: Secondary | ICD-10-CM | POA: Diagnosis not present

## 2017-11-02 DIAGNOSIS — E039 Hypothyroidism, unspecified: Secondary | ICD-10-CM | POA: Diagnosis not present

## 2017-11-02 DIAGNOSIS — K219 Gastro-esophageal reflux disease without esophagitis: Secondary | ICD-10-CM | POA: Diagnosis not present

## 2017-11-02 DIAGNOSIS — E785 Hyperlipidemia, unspecified: Secondary | ICD-10-CM | POA: Diagnosis not present

## 2017-11-06 DIAGNOSIS — E785 Hyperlipidemia, unspecified: Secondary | ICD-10-CM | POA: Diagnosis not present

## 2017-11-06 DIAGNOSIS — R413 Other amnesia: Secondary | ICD-10-CM | POA: Diagnosis not present

## 2017-12-09 ENCOUNTER — Other Ambulatory Visit: Payer: Self-pay | Admitting: Neurology

## 2018-01-02 ENCOUNTER — Ambulatory Visit: Payer: Self-pay | Admitting: Urology

## 2018-01-09 DIAGNOSIS — R413 Other amnesia: Secondary | ICD-10-CM | POA: Diagnosis not present

## 2018-01-09 DIAGNOSIS — Z23 Encounter for immunization: Secondary | ICD-10-CM | POA: Diagnosis not present

## 2018-01-09 DIAGNOSIS — R51 Headache: Secondary | ICD-10-CM | POA: Diagnosis not present

## 2018-01-18 ENCOUNTER — Ambulatory Visit: Payer: Self-pay | Admitting: Urology

## 2018-01-23 ENCOUNTER — Encounter: Payer: Self-pay | Admitting: Urology

## 2018-01-23 ENCOUNTER — Ambulatory Visit (INDEPENDENT_AMBULATORY_CARE_PROVIDER_SITE_OTHER): Payer: Medicare Other | Admitting: Urology

## 2018-01-23 VITALS — BP 109/70 | HR 78 | Ht 62.5 in | Wt 162.8 lb

## 2018-01-23 DIAGNOSIS — R32 Unspecified urinary incontinence: Secondary | ICD-10-CM | POA: Diagnosis not present

## 2018-01-23 DIAGNOSIS — R102 Pelvic and perineal pain: Secondary | ICD-10-CM

## 2018-01-23 DIAGNOSIS — N952 Postmenopausal atrophic vaginitis: Secondary | ICD-10-CM

## 2018-01-23 DIAGNOSIS — N3946 Mixed incontinence: Secondary | ICD-10-CM

## 2018-01-23 LAB — BLADDER SCAN AMB NON-IMAGING

## 2018-01-23 MED ORDER — ESTROGENS, CONJUGATED 0.625 MG/GM VA CREA: TOPICAL_CREAM | VAGINAL | 12 refills | Status: DC

## 2018-01-23 MED ORDER — ESTRADIOL 0.1 MG/GM VA CREA: TOPICAL_CREAM | VAGINAL | 12 refills | Status: DC

## 2018-01-23 NOTE — Patient Instructions (Signed)
I have given you two prescriptions for a vaginal estrogen cream.  Estrace and Premarin.  Please take these to your pharmacy and see which one your insurance covers.  If both are too expensive, please call the office at [phone] for an alternative.  You are given a sample of vaginal estrogen cream Premarin and instructed to apply 0.5mg (pea-sized amount)  just inside the vaginal introitus with a finger-tip on Monday, Wednesday and Friday nights,     

## 2018-01-23 NOTE — Progress Notes (Signed)
01/23/2018 10:03 PM   Sally Dunlap 11-Feb-1941 [id number]  Referring provider: Sofie Hartigan, MD [place], [place] [address]  Chief Complaint  Patient presents with  . Urinary Incontinence    HPI: Patient is a 77 -year-old Caucasian female who is referred to Sally Dunlap by [name] for urinary incontinence with her daughter, [name].   Patient states that she has had urinary incontinence for 8 months.    Patient has incontinence with urge and stress.   She is experiencing one to two incontinent episodes during the day.  She is experiencing one incontinent episodes during the night.  Her incontinence volume is large, soaking through small pads.  She is wearing two pads/depends daily.    She is having associated urgency.  Patient denies any gross hematuria, dysuria or suprapubic/flank pain.  Patient denies any fevers, chills, nausea or vomiting.   She does not have a history of urinary tract infections, STI's or injury to the bladder.   She does not have a history of nephrolithiasis, GU surgery or GU trauma.   She is not sexually active.    She is post menopausal.  She admits to diarrhea.   She is drinking two bottles of water daily.   She is not drinking any coffee or tea.  She is drinks a beer/wine occasionally.   Her PVR is 0 mL.    PMH: Past Medical History:  Diagnosis Date  . Diverticulitis   . Headache   . Hearing loss    hearing aidees  . Hyperlipidemia   . Hypertension   . Thyroid disease   . Varicose veins of both legs with edema     Surgical History: Past Surgical History:  Procedure Laterality Date  . CHOLECYSTECTOMY    . COLONOSCOPY W/ POLYPECTOMY    . KNEE ARTHROSCOPY     meniscus x 2 ; left  . KNEE SURGERY Right   . left ovary    . OVARY SURGERY      Home Medications:  Allergies as of 01/23/2018   No Known Allergies     Medication List        Accurate as of 01/23/18 11:59 PM. Always use your most recent  med list.          aspirin EC 81 MG tablet Take by mouth.   LIPITOR PO Take 10 mg by mouth every other day.   atorvastatin 10 MG tablet Commonly known as:  LIPITOR Take by mouth.   azithromycin 250 MG tablet Commonly known as:  ZITHROMAX Take 1 tablet (250 mg total) by mouth daily. Take first 2 tablets together, then 1 every day until finished.   CENTRUM SILVER 50+WOMEN PO Take by mouth.   chlorpheniramine-HYDROcodone 10-8 MG/5ML Suer Commonly known as:  TUSSIONEX Take 5 mLs by mouth at bedtime as needed for cough.   conjugated estrogens vaginal cream Commonly known as:  PREMARIN Apply 0.5mg  (pea-sized amount)  just inside the vaginal introitus with a finger-tip on  Monday, Wednesday and Friday nights.   COQ10 PO Take 100 mg by mouth daily.   [facility] COENZYME Q-10 100 MG capsule Generic drug:  Coenzyme Q10 TAKE ONE CAPSULE BY MOUTH ONCE DAILY WITH A MEAL   D3 ADULT PO Take 5,000 Units by mouth.   divalproex 500 MG 24 hr tablet Commonly known as:  DEPAKOTE ER Take 1 tablet (500 mg total) by mouth 2 (two) times daily.   donepezil 10 MG tablet Commonly known  as:  ARICEPT Take 1 tablet (10 mg total) by mouth at bedtime.   donepezil 10 MG tablet Commonly known as:  ARICEPT Take 1 tablet (10 mg total) by mouth at bedtime.   donepezil 10 MG tablet Commonly known as:  ARICEPT TAKE 1 TABLET AT BEDTIME   escitalopram 10 MG tablet Commonly known as:  LEXAPRO Take 10 mg by mouth daily.   escitalopram 5 MG tablet Commonly known as:  LEXAPRO   estradiol 0.1 MG/GM vaginal cream Commonly known as:  ESTRACE Apply 0.5mg  (pea-sized amount)  just inside the vaginal introitus with a finger-tip on Monday, Wednesday and Friday nights.   fluticasone 50 MCG/ACT nasal spray Commonly known as:  FLONASE Place 2 sprays into both nostrils daily.   gabapentin 100 MG capsule Commonly known as:  NEURONTIN   levothyroxine 200 MCG tablet Commonly known as:  SYNTHROID,  LEVOTHROID Take 200 mcg by mouth daily before breakfast.   levothyroxine 50 MCG tablet Commonly known as:  SYNTHROID, LEVOTHROID Take 50 mcg by mouth daily before breakfast.   Magnesium 400 MG Tabs Take by mouth.   predniSONE 20 MG tablet Commonly known as:  DELTASONE Take 2 tablets (40 mg total) by mouth daily.   ranitidine 150 MG capsule Commonly known as:  ZANTAC Take 150 mg by mouth daily.   VITAMIN B COMPLEX PO Take by mouth.       Allergies: No Known Allergies  Family History: Family History  Problem Relation Age of Onset  . Colon cancer Mother   . Stroke Brother     Social History:  reports that she has never smoked. She has never used smokeless tobacco. She reports that she drinks about 1.0 standard drinks of alcohol per week. She reports that she does not use drugs.  ROS: UROLOGY Frequent Urination?: No Hard to postpone urination?: Yes Burning/pain with urination?: No Get up at night to urinate?: No Leakage of urine?: Yes Urine stream starts and stops?: No Trouble starting stream?: No Do you have to strain to urinate?: No Blood in urine?: No Urinary tract infection?: No Sexually transmitted disease?: No Injury to kidneys or bladder?: No Painful intercourse?: No Weak stream?: No Currently pregnant?: No Vaginal bleeding?: No Last menstrual period?: Postmenopausal  Gastrointestinal Nausea?: No Vomiting?: No Indigestion/heartburn?: Yes Diarrhea?: Yes Constipation?: No  Constitutional Fever: No Night sweats?: No Weight loss?: No Fatigue?: Yes  Skin Skin rash/lesions?: No Itching?: No  Eyes Blurred vision?: No Double vision?: No  Ears/Nose/Throat Sore throat?: No Sinus problems?: No  Hematologic/Lymphatic Swollen glands?: No Easy bruising?: No  Cardiovascular Leg swelling?: Yes Chest pain?: No  Respiratory Cough?: No Shortness of breath?: Yes  Endocrine Excessive thirst?: No  Musculoskeletal Back pain?: No Joint  pain?: No  Neurological Headaches?: No Dizziness?: No  Psychologic Depression?: No Anxiety?: No  Physical Exam: BP 109/70 (BP Location: Left Arm, Patient Position: Sitting, Cuff Size: Large)   Pulse 78   Ht 5' 2.5" (1.588 m)   Wt 162 lb 12.8 oz (73.8 kg)   BMI 29.30 kg/m   Constitutional: Well nourished. Alert and oriented, No acute distress. HEENT: [place] AT, moist mucus membranes. Trachea midline, no masses. Cardiovascular: No clubbing, cyanosis, or edema. Respiratory: Normal respiratory effort, no increased work of breathing. GI: Abdomen is soft, diffuse tenderness, non distended, no abdominal masses. Liver and spleen not palpable.  No hernias appreciated.  Stool sample for occult testing is not indicated.   GU: No CVA tenderness.  No bladder fullness or masses.  Atrophic  external genitalia, normal pubic hair distribution, no lesions.   Normal urethral meatus, no lesions, no prolapse, no discharge.   No urethral masses, tenderness and/or tenderness. No bladder fullness, tenderness or masses. Pale vagina mucosa, poor estrogen effect, no discharge, no lesions, poor pelvic support, grade II cystocele noted and no rectocele noted.  No cervical motion tenderness.  Uterus is freely mobile and non-fixed.  Tenderness is noted in the adnexal/parametria area bilaterally.  Anus and perineum are without rashes or lesions.    Skin: No rashes, bruises or suspicious lesions. Lymph: No cervical or inguinal adenopathy. Neurologic: Grossly intact, no focal deficits, moving all 4 extremities. Psychiatric: Normal mood and affect.  Laboratory Data: Lab Results  Component Value Date   WBC 5.6 01/09/2017   HGB 13.2 01/09/2017   HCT 37.8 01/09/2017   MCV 89.6 01/09/2017   PLT 206 01/09/2017    Lab Results  Component Value Date   CREATININE 0.90 01/09/2017    No results found for: PSA  No results found for: TESTOSTERONE  No results found for: HGBA1C  Lab Results  Component Value Date   TSH  1.040 03/16/2016    No results found for: CHOL, HDL, CHOLHDL, VLDL, LDLCALC  Lab Results  Component Value Date   AST 27 03/16/2016   Lab Results  Component Value Date   ALT 23 03/16/2016   No components found for: ALKALINEPHOPHATASE No components found for: BILIRUBINTOTAL  No results found for: ESTRADIOL  Urinalysis    Component Value Date/Time   COLORURINE YELLOW 10/16/2014 [place] 10/16/2014 1825   LABSPEC 1.023 10/16/2014 1825   PHURINE 5.0 10/16/2014 1825   GLUCOSEU NEGATIVE 10/16/2014 1825   HGBUR NEGATIVE 10/16/2014 1825   BILIRUBINUR NEGATIVE 10/16/2014 1825   KETONESUR NEGATIVE 10/16/2014 1825   PROTEINUR NEGATIVE 10/16/2014 1825   UROBILINOGEN 0.2 10/16/2014 1825   NITRITE NEGATIVE 10/16/2014 1825   LEUKOCYTESUR SMALL (A) 10/16/2014 1825    I have reviewed the labs.   Pertinent Imaging: Results for JOANI, COSMA (MRN [id number]) as of 02/03/2018 22:02  Ref. Range 01/23/2018 13:38  Scan Result Unknown 1mL   Assessment & Plan:  1. Mixed Incontinence Discussed behavioral therapies, bladder training and bladder control strategies Fluid management  Bladder Scan (Post Void Residual) in office RTC in one month for OAB questionnaire and PVR  2. Vaginal atrophy Patient was given a sample of vaginal estrogen cream (Premarin vaginal cream) and instructed to apply 0.5mg  (pea-sized amount)  just inside the vaginal introitus with a finger-tip on Monday, Wednesday and Friday nights.  I explained to the patient that vaginally administered estrogen, which causes only a slight increase in the blood estrogen levels, have fewer contraindications and adverse systemic effects that oral HT. I have also given prescriptions for the Estrace cream and Premarin cream, so that the patient may carry them to the pharmacy to see which one of the branded creams would be most economical for her.  If she finds both medications cost prohibitive, she is instructed  to call the office.  We can then call in a compounded vaginal estrogen cream for the patient that may be more affordable.  She will follow up in one month for an exam.    3. Pelvic pain Patient's daughter is very concerned and has been asking other providers to do imaging studies We will obtain a pelvic ultrasound for further evaluation  Return in about 1 month (around 02/23/2018) for OAB questionnaire, PVR and exam.  These notes  generated with voice recognition software. I apologize for typographical errors.  Zara Council, PA-C  [facility] [address] [place], [place] [address] [phone]

## 2018-02-05 ENCOUNTER — Ambulatory Visit
Admission: RE | Admit: 2018-02-05 | Discharge: 2018-02-05 | Disposition: A | Discharge status: Home / Self Care | Payer: Medicare Other | Source: Ambulatory Visit | Attending: Urology | Admitting: Urology

## 2018-02-05 DIAGNOSIS — R102 Pelvic and perineal pain: Secondary | ICD-10-CM | POA: Diagnosis not present

## 2018-02-22 DIAGNOSIS — L821 Other seborrheic keratosis: Secondary | ICD-10-CM | POA: Diagnosis not present

## 2018-02-22 DIAGNOSIS — L820 Inflamed seborrheic keratosis: Secondary | ICD-10-CM | POA: Diagnosis not present

## 2018-02-26 ENCOUNTER — Encounter: Payer: Self-pay | Admitting: Urology

## 2018-02-26 ENCOUNTER — Other Ambulatory Visit: Payer: Self-pay | Admitting: Urology

## 2018-02-26 ENCOUNTER — Ambulatory Visit (INDEPENDENT_AMBULATORY_CARE_PROVIDER_SITE_OTHER): Payer: Medicare Other | Admitting: Urology

## 2018-02-26 VITALS — BP 126/73 | HR 65 | Ht 62.5 in | Wt 161.6 lb

## 2018-02-26 DIAGNOSIS — N3946 Mixed incontinence: Secondary | ICD-10-CM

## 2018-02-26 LAB — BLADDER SCAN AMB NON-IMAGING

## 2018-02-26 MED ORDER — ESTRADIOL 0.1 MG/GM VA CREA: TOPICAL_CREAM | VAGINAL | 12 refills | Status: DC

## 2018-02-26 MED ORDER — ESTROGENS, CONJUGATED 0.625 MG/GM VA CREA: TOPICAL_CREAM | VAGINAL | 12 refills | Status: DC

## 2018-02-26 NOTE — Progress Notes (Deleted)
02/26/2018 4:17 PM   Sally Dunlap 11-01-1940 [id number]  Referring provider: Sofie Hartigan, MD [place], [place] [address]  Chief Complaint  Patient presents with  . Follow-up  . 1 month    HPI: Patient is a 77 year old Caucasian female with mixed urinary incontinence, vaginal atrophy and pelvic pain who presents today for one month after a trial of vaginal estrogen cream.   Background history Patient is a 62 -year-old Caucasian female who is referred to [name] by [name] for urinary incontinence with her daughter, [name].   Patient states that she has had urinary incontinence for 8 months.  Patient has incontinence with urge and stress.   She is experiencing one to two incontinent episodes during the day.  She is experiencing one incontinent episodes during the night.  Her incontinence volume is large, soaking through small pads.  She is wearing two pads/depends daily.  She is having associated urgency.  Patient denies any gross hematuria, dysuria or suprapubic/flank pain.  Patient denies any fevers, chills, nausea or vomiting.  She does not have a history of urinary tract infections, STI's or injury to the bladder.   She does not have a history of nephrolithiasis, GU surgery or GU trauma.   She is not sexually active.  She is post menopausal.  She admits to diarrhea.   She is drinking two bottles of water daily.   She is not drinking any coffee or tea.  She is drinks a beer/wine occasionally.   Her PVR was 0 mL.    At her visit on 01/23/2018, she was placed on vaginal estrogen cream and a pelvic ultrasound was ordered as daughter states her mother has been complaining of pelvic pain for several months.    Pelvic ultrasound on 02/05/2018 no acute abnormality within the pelvis.  Nonvisualization of the ovaries. No adnexal or pelvic mass identified.  Uterus and endometrial stripe within normal limits for age and postmenopausal status.   Today, she  complains of urgency and incontinence.  The patient is  experiencing urgency x 0-3, frequency x 0-3, not restricting fluids to avoid visits to the restroom, is engaging in toilet mapping, incontinence x 0-3 and nocturia x 0-3.   Her BP is 126/73.   Her PVR is 0 mL.     PMH: Past Medical History:  Diagnosis Date  . Diverticulitis   . Headache   . Hearing loss    hearing aidees  . Hyperlipidemia   . Hypertension   . Thyroid disease   . Varicose veins of both legs with edema     Surgical History: Past Surgical History:  Procedure Laterality Date  . CHOLECYSTECTOMY    . COLONOSCOPY W/ POLYPECTOMY    . KNEE ARTHROSCOPY     meniscus x 2 ; left  . KNEE SURGERY Right   . left ovary    . OVARY SURGERY      Home Medications:  Allergies as of 02/26/2018   No Known Allergies     Medication List        Accurate as of 02/26/18  4:17 PM. Always use your most recent med list.          donepezil 10 MG tablet Commonly known as:  ARICEPT Take 1 tablet (10 mg total) by mouth at bedtime.   escitalopram 5 MG tablet Commonly known as:  LEXAPRO   gabapentin 100 MG capsule Commonly known as:  NEURONTIN   levothyroxine 200 MCG tablet  Commonly known as:  SYNTHROID, LEVOTHROID Take 200 mcg by mouth daily before breakfast.   LIPITOR PO Take 10 mg by mouth every other day.   Magnesium 400 MG Tabs Take by mouth.   ranitidine 150 MG capsule Commonly known as:  ZANTAC Take 150 mg by mouth daily.       Allergies: No Known Allergies  Family History: Family History  Problem Relation Age of Onset  . Colon cancer Mother   . Stroke Brother     Social History:  reports that she has never smoked. She has never used smokeless tobacco. She reports that she drinks about 1.0 standard drinks of alcohol per week. She reports that she does not use drugs.  ROS: UROLOGY Frequent Urination?: No Hard to postpone urination?: Yes Burning/pain with urination?: No Get up at night to  urinate?: No Leakage of urine?: Yes Urine stream starts and stops?: No Trouble starting stream?: No Do you have to strain to urinate?: No Blood in urine?: No Urinary tract infection?: No Sexually transmitted disease?: No Injury to kidneys or bladder?: No Painful intercourse?: No Weak stream?: No Currently pregnant?: No Vaginal bleeding?: No Last menstrual period?: n  Gastrointestinal Nausea?: No Vomiting?: No Indigestion/heartburn?: No Diarrhea?: No Constipation?: No  Constitutional Fever: No Night sweats?: No Weight loss?: No Fatigue?: No  Skin Skin rash/lesions?: No Itching?: No  Eyes Blurred vision?: No Double vision?: No  Ears/Nose/Throat Sore throat?: No Sinus problems?: No  Hematologic/Lymphatic Swollen glands?: No Easy bruising?: No  Cardiovascular Leg swelling?: No Chest pain?: No  Respiratory Cough?: No Shortness of breath?: No  Endocrine Excessive thirst?: No  Musculoskeletal Back pain?: No Joint pain?: No  Neurological Headaches?: No Dizziness?: No  Psychologic Depression?: Yes Anxiety?: No  Physical Exam: BP 126/73 (BP Location: Left Arm, Patient Position: Sitting, Cuff Size: Normal)   Pulse 65   Ht 5' 2.5" (1.588 m)   Wt 161 lb 9.6 oz (73.3 kg)   BMI 29.09 kg/m   Constitutional: Well nourished. Alert and oriented, No acute distress. HEENT: [place] AT, moist mucus membranes. Trachea midline, no masses. Cardiovascular: No clubbing, cyanosis, or edema. Respiratory: Normal respiratory effort, no increased work of breathing. GI: Abdomen is soft, diffuse tenderness, non distended, no abdominal masses. Liver and spleen not palpable.  No hernias appreciated.  Stool sample for occult testing is not indicated.   GU: No CVA tenderness.  No bladder fullness or masses.  Atrophic external genitalia, normal pubic hair distribution, no lesions.   Normal urethral meatus, no lesions, no prolapse, no discharge.   No urethral masses, tenderness  and/or tenderness. No bladder fullness, tenderness or masses. Pale vagina mucosa, poor estrogen effect, no discharge, no lesions, poor pelvic support, grade II cystocele noted and no rectocele noted.  No cervical motion tenderness.  Uterus is freely mobile and non-fixed.  Tenderness is noted in the adnexal/parametria area bilaterally.  Anus and perineum are without rashes or lesions.    Skin: No rashes, bruises or suspicious lesions. Lymph: No cervical or inguinal adenopathy. Neurologic: Grossly intact, no focal deficits, moving all 4 extremities. Psychiatric: Normal mood and affect.  Laboratory Data: Lab Results  Component Value Date   WBC 5.6 01/09/2017   HGB 13.2 01/09/2017   HCT 37.8 01/09/2017   MCV 89.6 01/09/2017   PLT 206 01/09/2017    Lab Results  Component Value Date   CREATININE 0.90 01/09/2017    No results found for: PSA  No results found for: TESTOSTERONE  No results found for:  HGBA1C  Lab Results  Component Value Date   TSH 1.040 03/16/2016    No results found for: CHOL, HDL, CHOLHDL, VLDL, LDLCALC  Lab Results  Component Value Date   AST 27 03/16/2016   Lab Results  Component Value Date   ALT 23 03/16/2016   No components found for: ALKALINEPHOPHATASE No components found for: BILIRUBINTOTAL  No results found for: ESTRADIOL  Urinalysis    Component Value Date/Time   COLORURINE YELLOW 10/16/2014 [place] 10/16/2014 1825   LABSPEC 1.023 10/16/2014 1825   PHURINE 5.0 10/16/2014 1825   GLUCOSEU NEGATIVE 10/16/2014 1825   HGBUR NEGATIVE 10/16/2014 1825   BILIRUBINUR NEGATIVE 10/16/2014 1825   KETONESUR NEGATIVE 10/16/2014 1825   PROTEINUR NEGATIVE 10/16/2014 1825   UROBILINOGEN 0.2 10/16/2014 1825   NITRITE NEGATIVE 10/16/2014 1825   LEUKOCYTESUR SMALL (A) 10/16/2014 1825    I have reviewed the labs.   Pertinent Imaging: Results for AVIS, TIRONE (MRN [id number]) as of 02/03/2018 22:02  Ref. Range 01/23/2018  13:38  Scan Result Unknown 3mL   Assessment & Plan:  1. Mixed Incontinence Discussed behavioral therapies, bladder training and bladder control strategies Fluid management  Bladder Scan (Post Void Residual) in office RTC in one month for OAB questionnaire and PVR  2. Vaginal atrophy Patient was given a sample of vaginal estrogen cream (Premarin vaginal cream) and instructed to apply 0.5mg  (pea-sized amount)  just inside the vaginal introitus with a finger-tip on Monday, Wednesday and Friday nights.  I explained to the patient that vaginally administered estrogen, which causes only a slight increase in the blood estrogen levels, have fewer contraindications and adverse systemic effects that oral HT. I have also given prescriptions for the Estrace cream and Premarin cream, so that the patient may carry them to the pharmacy to see which one of the branded creams would be most economical for her.  If she finds both medications cost prohibitive, she is instructed to call the office.  We can then call in a compounded vaginal estrogen cream for the patient that may be more affordable.  She will follow up in one month for an exam.    3. Pelvic pain Patient's daughter is very concerned and has been asking other providers to do imaging studies We will obtain a pelvic ultrasound for further evaluation  No follow-ups on file.  These notes generated with voice recognition software. I apologize for typographical errors.  Zara Council, PA-C  [facility] [address] [place], [place] [address] [phone]

## 2018-02-26 NOTE — Patient Instructions (Signed)
I have given you two prescriptions for a vaginal estrogen cream.  Estrace and Premarin.  Please take these to your pharmacy and see which one your insurance covers.  If both are too expensive, please call the office at [phone] for an alternative.  You are given a sample of vaginal estrogen cream Premarin and instructed to apply 0.5mg (pea-sized amount)  just inside the vaginal introitus with a finger-tip on Monday, Wednesday and Friday nights,     

## 2018-02-26 NOTE — Progress Notes (Addendum)
02/26/2018 4:57 PM   Sally Dunlap 07/27/1940 [id number]  Referring provider: Sofie Hartigan, MD [place] [facility], [place] [address]  Chief Complaint  Patient presents with  . Follow-up  . 1 month    HPI: Patient is a 77 year old Caucasian female with mixed urinary incontinence, vaginal atrophy and pelvic pain who presents today for one month after a trial of vaginal estrogen cream. Patient is accompanied by her daughter. Pt and daughter shared presentation of history. Pt is an unreliable historian. Her PVR is 38mL.  Patient experienced burning upon application of vaginal cream and while she hasn't experienced pain since then she would prefer not to continue with it. Pt stopped using vaginal cream about a week ago since she "used up the tube".  Daughter reports pt using panty liners for leakage and "hasn't been soaking through them." Daughter reports pt has increased water intake daily (more than 2 bottles daily). Drinks 12 oz bottle of water before bed and drinks 1-2 bottles during the day. Patient denies dry mouth and does not wake up to drink water throughout the night.   Pt says bladder leakage is not currently bothering her although if she increases her water intake then she experiences leakage. Pt does not feel that leakage is an issue effecting life quality and/or experiences. Daughter describes that there are situations where she experiences accidents (for ex: soaking through clothing while walking and trying on clothes in a store) about 2x/year on outings unexpectedly.  Background history Patient is a 86 -year-old Caucasian female who is referred to [name] by Dr. Thereasa Dunlap for urinary incontinence with her daughter, Sally Dunlap.   Patient states that she has had urinary incontinence for 8 months.  Patient has incontinence with urge and stress.   She is experiencing one to two incontinent episodes during the day.  She is experiencing one incontinent episodes during  the night.  Her incontinence volume is large, soaking through small pads.  She is wearing two pads/depends daily.  She is having associated urgency.  Patient denies any gross hematuria, dysuria or suprapubic/flank pain.  Patient denies any fevers, chills, nausea or vomiting.  She does not have a history of urinary tract infections, STI's or injury to the bladder.   She does not have a history of nephrolithiasis, GU surgery or GU trauma.   She is not sexually active.  She is post menopausal.  She admits to diarrhea.   She is drinking two bottles of water daily.   She is not drinking any coffee or tea.  She is drinks a beer/wine occasionally.   Her PVR was 0 mL.    At her visit on 01/23/2018, she was placed on vaginal estrogen cream and a pelvic ultrasound was ordered as daughter states her mother has been complaining of pelvic pain for several months.    Pelvic ultrasound on 02/05/2018 no acute abnormality within the pelvis.  Nonvisualization of the ovaries. No adnexal or pelvic mass identified.  Uterus and endometrial stripe within normal limits for age and postmenopausal status.   Today, she complains of urgency and incontinence.  The patient is  experiencing urgency x 0-3, frequency x 0-3, not restricting fluids to avoid visits to the restroom, is engaging in toilet mapping, incontinence x 0-3 and nocturia x 0-3.   Her BP is 126/73.   Her PVR is 0 mL.     PMH: Past Medical History:  Diagnosis Date  . Diverticulitis   . Headache   .  Hearing loss    hearing aidees  . Hyperlipidemia   . Hypertension   . Thyroid disease   . Varicose veins of both legs with edema     Surgical History: Past Surgical History:  Procedure Laterality Date  . CHOLECYSTECTOMY    . COLONOSCOPY W/ POLYPECTOMY    . KNEE ARTHROSCOPY     meniscus x 2 ; left  . KNEE SURGERY Right   . left ovary    . OVARY SURGERY      Home Medications:  Allergies as of 02/26/2018   No Known Allergies     Medication List          Accurate as of 02/26/18  4:57 PM. Always use your most recent med list.          conjugated estrogens vaginal cream Commonly known as:  PREMARIN Apply 0.5mg  (pea-sized amount)  just inside the vaginal introitus with a finger-tip on  Monday, Wednesday and Friday nights.   donepezil 10 MG tablet Commonly known as:  ARICEPT Take 1 tablet (10 mg total) by mouth at bedtime.   escitalopram 5 MG tablet Commonly known as:  LEXAPRO   estradiol 0.1 MG/GM vaginal cream Commonly known as:  ESTRACE Apply 0.5mg  (pea-sized amount)  just inside the vaginal introitus with a finger-tip on Monday, Wednesday and Friday nights.   gabapentin 100 MG capsule Commonly known as:  NEURONTIN   levothyroxine 200 MCG tablet Commonly known as:  SYNTHROID, LEVOTHROID Take 200 mcg by mouth daily before breakfast.   LIPITOR PO Take 10 mg by mouth every other day.   Magnesium 400 MG Tabs Take by mouth.   ranitidine 150 MG capsule Commonly known as:  ZANTAC Take 150 mg by mouth daily.       Allergies: No Known Allergies  Family History: Family History  Problem Relation Age of Onset  . Colon cancer Mother   . Stroke Brother     Social History:  reports that she has never smoked. She has never used smokeless tobacco. She reports that she drinks about 1.0 standard drinks of alcohol per week. She reports that she does not use drugs.  ROS: UROLOGY Frequent Urination?: No Hard to postpone urination?: Yes Burning/pain with urination?: No Get up at night to urinate?: No Leakage of urine?: Yes Urine stream starts and stops?: No Trouble starting stream?: No Do you have to strain to urinate?: No Blood in urine?: No Urinary tract infection?: No Sexually transmitted disease?: No Injury to kidneys or bladder?: No Painful intercourse?: No Weak stream?: No Currently pregnant?: No Vaginal bleeding?: No Last menstrual period?: n  Gastrointestinal Nausea?: No Vomiting?:  No Indigestion/heartburn?: No Diarrhea?: No Constipation?: No  Constitutional Fever: No Night sweats?: No Weight loss?: No Fatigue?: No  Skin Skin rash/lesions?: No Itching?: No  Eyes Blurred vision?: No Double vision?: No  Ears/Nose/Throat Sore throat?: No Sinus problems?: No  Hematologic/Lymphatic Swollen glands?: No Easy bruising?: No  Cardiovascular Leg swelling?: No Chest pain?: No  Respiratory Cough?: No Shortness of breath?: No  Endocrine Excessive thirst?: No  Musculoskeletal Back pain?: No Joint pain?: No  Neurological Headaches?: No Dizziness?: No  Psychologic Depression?: Yes Anxiety?: No  Physical Exam: BP 126/73 (BP Location: Left Arm, Patient Position: Sitting, Cuff Size: Normal)   Pulse 65   Ht 5' 2.5" (1.588 m)   Wt 161 lb 9.6 oz (73.3 kg)   BMI 29.09 kg/m   Constitutional: Well nourished. Alert and oriented, No acute distress. HEENT: [place] AT, moist  mucus membranes. Trachea midline, no masses. Cardiovascular: No clubbing, cyanosis, or edema. Respiratory: Normal respiratory effort, no increased work of breathing. GI: Abdomen is soft, diffuse tenderness, non distended, no abdominal masses. Liver and spleen not palpable.  No hernias appreciated.  Stool sample for occult testing is not indicated.   GU: No CVA tenderness.  No bladder fullness or masses.  Atrophic external genitalia, normal pubic hair distribution, no lesions.   Normal urethral meatus, no lesions, no prolapse, no discharge.  No urethral masses, tenderness and/or tenderness. No bladder fullness, tenderness or masses. Pale vagina mucosa, poor estrogen effect, no discharge, no lesions, poor pelvic support, grade II cystocele noted and no rectocele noted.  No cervical motion tenderness.  Uterus is freely mobile and non-fixed.  Tenderness is noted in the adnexal/parametria area bilaterally.  Anus and perineum are without rashes or lesions.    Skin: No rashes, bruises or suspicious  lesions. Lymph: No cervical or inguinal adenopathy. Neurologic: Grossly intact, no focal deficits, moving all 4 extremities. Psychiatric: Normal mood and affect.  Laboratory Data: Lab Results  Component Value Date   WBC 5.6 01/09/2017   HGB 13.2 01/09/2017   HCT 37.8 01/09/2017   MCV 89.6 01/09/2017   PLT 206 01/09/2017    Lab Results  Component Value Date   CREATININE 0.90 01/09/2017    No results found for: PSA  No results found for: TESTOSTERONE  No results found for: HGBA1C  Lab Results  Component Value Date   TSH 1.040 03/16/2016    No results found for: CHOL, HDL, CHOLHDL, VLDL, LDLCALC  Lab Results  Component Value Date   AST 27 03/16/2016   Lab Results  Component Value Date   ALT 23 03/16/2016   No components found for: ALKALINEPHOPHATASE No components found for: BILIRUBINTOTAL  No results found for: ESTRADIOL  Urinalysis    Component Value Date/Time   COLORURINE YELLOW 10/16/2014 [place] 10/16/2014 1825   LABSPEC 1.023 10/16/2014 1825   PHURINE 5.0 10/16/2014 1825   GLUCOSEU NEGATIVE 10/16/2014 1825   HGBUR NEGATIVE 10/16/2014 1825   BILIRUBINUR NEGATIVE 10/16/2014 1825   KETONESUR NEGATIVE 10/16/2014 1825   PROTEINUR NEGATIVE 10/16/2014 1825   UROBILINOGEN 0.2 10/16/2014 1825   NITRITE NEGATIVE 10/16/2014 1825   LEUKOCYTESUR SMALL (A) 10/16/2014 1825    I have reviewed the labs.   Pertinent Imaging: CLINICAL DATA:  Initial evaluation for persistent pelvic pain for approximately 1 year. History of prior left oophorectomy.  EXAM: TRANSABDOMINAL AND TRANSVAGINAL ULTRASOUND OF PELVIS  TECHNIQUE: Both transabdominal and transvaginal ultrasound examinations of the pelvis were performed. Transabdominal technique was performed for global imaging of the pelvis including uterus, ovaries, adnexal regions, and pelvic cul-de-sac. It was necessary to proceed with endovaginal exam following the transabdominal exam to  visualize the pelvic structures.  COMPARISON:  None  FINDINGS: Uterus  Measurements: 4.4 x 1.9 x 2.2 cm. No fibroids or other mass visualized. Few subcentimeter echogenic foci noted within the myometrium at the level of the lower uterine segment, nonspecific, but of doubtful clinical significance.  Endometrium  Thickness: 2 mm.  No focal abnormality visualized.  Right ovary  Not visualized.  No adnexal mass.  Left ovary  Not visualized.  No adnexal mass.  Other findings  No abnormal free fluid.  IMPRESSION: 1. No acute abnormality within the pelvis. 2. Nonvisualization of the ovaries. No adnexal or pelvic mass identified. 3. Uterus and endometrial stripe within normal limits for age and postmenopausal status.   Electronically  Signed   By: Jeannine Boga M.D.   On: 02/05/2018 21:41  Results for AMBERLIE, GAILLARD (MRN [id number]) as of 02/03/2018 22:02  Ref. Range 01/23/2018 13:38 02/26/18  Scan Result Unknown 4mL 44mL   Assessment & Plan:  1. Mixed Incontinence - [name] results negative - We reviewed the results of pelvic ultrasound with patient and daughter. - Offered refer to gynecology for a pessary fitting  - Daughter believes that the incontinence is not severe enough for the pessary at this time -Offered physical therapy for pelvic floor; Daughter expresses inability to commit to that at this time  - RTC in 3 months for OAB questionnaire and PVR   2. Vaginal atrophy - Re-offered trial with vaginal estrogen cream (Premarin); Pt is interested in retrying this treatment plan  - We discussed the importance of treating incontinence (avoiding UTIs, etc.) and the benefits of this treatment plan - Patient was given a sample of vaginal estrogen cream (Premarin vaginal cream) and instructed to apply 0.5mg  (pea-sized amount)  just inside the vaginal introitus with a finger-tip on Monday, Wednesday and Friday nights.  I explained to the patient  that vaginally administered estrogen, which causes only a slight increase in the blood estrogen levels, have fewer contraindications and adverse systemic effects that oral HT. - I have also given prescriptions for the Estrace cream and Premarin cream, so that the patient may carry them to the pharmacy to see which one of the branded creams would be most economical for her.  If she finds both medications cost prohibitive, she is instructed to call the office.  We can then call in a compounded vaginal estrogen cream for the patient that may be more affordable.  - She will follow up in three months for an exam.      Return in about 3 months (around 05/29/2018) for exam .  These notes generated with voice recognition software. I apologize for typographical errors.  Laneta Simmers  [place] [address] [place], [place] [address] [phone]  I, Temidayo Atanda-Ogunleye , am acting as a scribe for Zara Council, PA-C  I have reviewed the above documentation for accuracy and completeness, and I agree with the above.    Zara Council, PA-C

## 2018-05-28 NOTE — Progress Notes (Incomplete)
05/28/2018 1:14 PM   Sally Dunlap Oct 13, 1940 [id number]  Referring provider: Sofie Hartigan, [place], [place] [address]  No chief complaint on file.   HPI: Sally Dunlap is a 78 y.o. female Caucasian with mixed urinary incontinence, vaginal atrophy and pelvic pain who presents today for one month after a trial of vaginal estrogen cream.{  Patient is accompanied by her daughter.  Pt and daughter shared presentation of history.}  Pt is an unreliable historian.  ***  Background history Patient is a 67 -year-old Caucasian female who is referred to Sally Dunlap by Dr. Thereasa Dunlap for urinary incontinence with her daughter, Sally Dunlap.   Patient states that she has had urinary incontinence for 8 months.  Patient has incontinence with urge and stress.   She is experiencing one to two incontinent episodes during the day.  She is experiencing one incontinent episodes during the night.  Her incontinence volume is large, soaking through small pads.  She is wearing two pads/depends daily.  She is having associated urgency.  Patient denies any gross hematuria, dysuria or suprapubic/flank pain.  Patient denies any fevers, chills, nausea or vomiting.  She does not have a history of urinary tract infections, STI's or injury to the bladder.   She does not have a history of nephrolithiasis, GU surgery or GU trauma.   She is not sexually active.  She is post menopausal.  She admits to diarrhea.   She is drinking two bottles of water daily.   She is not drinking any coffee or tea.  She is drinks a beer/wine occasionally.   Her PVR was 0 mL.    At her visit on 01/23/2018, she was placed on vaginal estrogen cream and a pelvic ultrasound was ordered as daughter states her mother has been complaining of pelvic pain for several months.    Pelvic ultrasound on 02/05/2018 no acute abnormality within the pelvis.  Nonvisualization of the ovaries. No adnexal or pelvic mass identified.  Uterus  and endometrial stripe within normal limits for age and postmenopausal status.   On 02/26/2018, she complained of urgency and incontinence.  The patient was experiencing urgency x 0-3, frequency x 0-3, not restricting fluids to avoid visits to the restroom, was engaging in toilet mapping, incontinence x 0-3 and nocturia x 0-3.  Her BP was 126/73.  Her PVR was 0 mL.    On her 02/26/2018 visit while accompanied by her daughter, patient reported that she experienced burning upon application of vaginal cream and while she hadn't experienced pain since then she would prefer not to continue with it.  Pt had stopped using vaginal cream about a week prior to the visit since she "used up the tube".  Daughter reported pt using panty liners for leakage and "hasn't been soaking through them." Daughter reported pt had increased water intake daily (more than 2 bottles daily). Drinks 12 oz bottle of water before bed and drinks 1-2 bottles during the day. Patient denied dry mouth and does not wake up to drink water throughout the night.   Pt says bladder leakage was not currently bothering her although if she increases her water intake then she experiences leakage. Pt did not feel that leakage was an issue effecting life quality and/or experiences. Daughter described that there are situations where she experiences accidents (for ex: soaking through clothing while walking and trying on clothes in a store) about 2x/year on outings unexpectedly.  PMH: Past Medical History:  Diagnosis Date  Diverticulitis    Headache    Hearing loss    hearing aidees   Hyperlipidemia    Hypertension    Thyroid disease    Varicose veins of both legs with edema     Surgical History: Past Surgical History:  Procedure Laterality Date   CHOLECYSTECTOMY     COLONOSCOPY W/ POLYPECTOMY     KNEE ARTHROSCOPY     meniscus x 2 ; left   KNEE SURGERY Right    left ovary     OVARY SURGERY      Home Medications:  Allergies  as of 05/29/2018   No Known Allergies     Medication List       Accurate as of May 28, 2018  1:14 PM. Always use your most recent med list.        conjugated estrogens vaginal cream Commonly known as:  PREMARIN Apply 0.5mg  (pea-sized amount)  just inside the vaginal introitus with a finger-tip on  Monday, Wednesday and Friday nights.   donepezil 10 MG tablet Commonly known as:  ARICEPT Take 1 tablet (10 mg total) by mouth at bedtime.   escitalopram 5 MG tablet Commonly known as:  LEXAPRO   estradiol 0.1 MG/GM vaginal cream Commonly known as:  ESTRACE VAGINAL Apply 0.5mg  (pea-sized amount)  just inside the vaginal introitus with a finger-tip on Monday, Wednesday and Friday nights.   gabapentin 100 MG capsule Commonly known as:  NEURONTIN   levothyroxine 200 MCG tablet Commonly known as:  SYNTHROID, LEVOTHROID Take 200 mcg by mouth daily before breakfast.   LIPITOR PO Take 10 mg by mouth every other day.   Magnesium 400 MG Tabs Take by mouth.   ranitidine 150 MG capsule Commonly known as:  ZANTAC Take 150 mg by mouth daily.       Allergies: No Known Allergies  Family History: Family History  Problem Relation Age of Onset   Colon cancer Mother    Stroke Brother     Social History:  reports that she has never smoked. She has never used smokeless tobacco. She reports current alcohol use of about 1.0 standard drinks of alcohol per week. She reports that she does not use drugs.  ROS:                                        Physical Exam: There were no vitals taken for this visit.  Constitutional: Well nourished. Alert and oriented, No acute distress. HEENT: [place] AT, moist mucus membranes. Trachea midline, no masses. Cardiovascular: No clubbing, cyanosis, or edema. Respiratory: Normal respiratory effort, no increased work of breathing. GI: Abdomen is soft, diffuse tenderness, non distended, no abdominal masses. Liver and spleen  not palpable.  No hernias appreciated.  Stool sample for occult testing is not indicated.   GU: No CVA tenderness.  No bladder fullness or masses.  Atrophic external genitalia, normal pubic hair distribution, no lesions.   Normal urethral meatus, no lesions, no prolapse, no discharge.  No urethral masses, tenderness and/or tenderness. No bladder fullness, tenderness or masses. Pale vagina mucosa, poor estrogen effect, no discharge, no lesions, poor pelvic support, grade II cystocele noted and no rectocele noted.  No cervical motion tenderness.  Uterus is freely mobile and non-fixed.  Tenderness is noted in the adnexal/parametria area bilaterally.  Anus and perineum are without rashes or lesions.    Skin: No rashes, bruises or suspicious  lesions. Lymph: No cervical or inguinal adenopathy. Neurologic: Grossly intact, no focal deficits, moving all 4 extremities. Psychiatric: Normal mood and affect.  Laboratory Data: Lab Results  Component Value Date   WBC 5.6 01/09/2017   HGB 13.2 01/09/2017   HCT 37.8 01/09/2017   MCV 89.6 01/09/2017   PLT 206 01/09/2017    Lab Results  Component Value Date   CREATININE 0.90 01/09/2017    No results found for: PSA  No results found for: TESTOSTERONE  No results found for: HGBA1C  Lab Results  Component Value Date   TSH 1.040 03/16/2016    No results found for: CHOL, HDL, CHOLHDL, VLDL, LDLCALC  Lab Results  Component Value Date   AST 27 03/16/2016   Lab Results  Component Value Date   ALT 23 03/16/2016   No components found for: ALKALINEPHOPHATASE No components found for: BILIRUBINTOTAL  No results found for: ESTRADIOL  Urinalysis    Component Value Date/Time   COLORURINE YELLOW 10/16/2014 [place] 10/16/2014 1825   LABSPEC 1.023 10/16/2014 1825   PHURINE 5.0 10/16/2014 1825   GLUCOSEU NEGATIVE 10/16/2014 1825   HGBUR NEGATIVE 10/16/2014 1825   BILIRUBINUR NEGATIVE 10/16/2014 1825   KETONESUR NEGATIVE  10/16/2014 1825   PROTEINUR NEGATIVE 10/16/2014 1825   UROBILINOGEN 0.2 10/16/2014 1825   NITRITE NEGATIVE 10/16/2014 1825   LEUKOCYTESUR SMALL (A) 10/16/2014 1825    I have reviewed the labs.   Pertinent Imaging: CLINICAL DATA:  Initial evaluation for persistent pelvic pain for approximately 1 year. History of prior left oophorectomy.  EXAM: TRANSABDOMINAL AND TRANSVAGINAL ULTRASOUND OF PELVIS  TECHNIQUE: Both transabdominal and transvaginal ultrasound examinations of the pelvis were performed. Transabdominal technique was performed for global imaging of the pelvis including uterus, ovaries, adnexal regions, and pelvic cul-de-sac. It was necessary to proceed with endovaginal exam following the transabdominal exam to visualize the pelvic structures.  COMPARISON:  None  FINDINGS: Uterus  Measurements: 4.4 x 1.9 x 2.2 cm. No fibroids or other mass visualized. Few subcentimeter echogenic foci noted within the myometrium at the level of the lower uterine segment, nonspecific, but of doubtful clinical significance.  Endometrium  Thickness: 2 mm.  No focal abnormality visualized.  Right ovary  Not visualized.  No adnexal mass.  Left ovary  Not visualized.  No adnexal mass.  Other findings  No abnormal free fluid.  IMPRESSION: 1. No acute abnormality within the pelvis. 2. Nonvisualization of the ovaries. No adnexal or pelvic mass identified. 3. Uterus and endometrial stripe within normal limits for age and postmenopausal status.   Electronically Signed   By: Jeannine Boga M.D.   On: 02/05/2018 21:41  Results for KASEY, HANSELL (MRN [id number]) as of 02/03/2018 22:02  Ref. Range 01/23/2018 13:38 02/26/18  Scan Result Unknown 16mL 43mL   Assessment & Plan:  1. Mixed Incontinence *** - Sally Dunlap results negative *** - We reviewed the results of pelvic ultrasound with patient and daughter. *** - Offered refer to gynecology for a  pessary fitting ***  - Daughter believes that the incontinence is not severe enough for the pessary at this time *** -Offered physical therapy for pelvic floor; Daughter expresses inability to commit to that at this time  *** - RTC in 3 months for OAB questionnaire and PVR   2. Vaginal atrophy *** - Re-offered trial with vaginal estrogen cream (Premarin); Pt is interested in retrying this treatment plan ***  - We discussed the importance of treating incontinence (avoiding UTIs, etc.)  and the benefits of this treatment plan *** - Patient was given a sample of vaginal estrogen cream (Premarin vaginal cream) and instructed to apply 0.5mg  (pea-sized amount)  just inside the vaginal introitus with a finger-tip on Monday, Wednesday and Friday nights.  I explained to the patient that vaginally administered estrogen, which causes only a slight increase in the blood estrogen levels, have fewer contraindications and adverse systemic effects that oral HT. *** - I have also given prescriptions for the Estrace cream and Premarin cream, so that the patient may carry them to the pharmacy to see which one of the branded creams would be most economical for her.  If she finds both medications cost prohibitive, she is instructed to call the office.  We can then call in a compounded vaginal estrogen cream for the patient that may be more affordable.  *** - She will follow up in three months for an exam.      No follow-ups on file.  Laneta Simmers  [facility] [address] [place], [place] [address] [phone]  I, Adele Schilder, am acting as a Education administrator for [employer], PA-C.   {Add Scribe Attestation Statement}

## 2018-05-29 ENCOUNTER — Ambulatory Visit: Payer: Private Health Insurance - Indemnity | Admitting: Urology

## 2018-06-02 NOTE — Progress Notes (Signed)
06/03/2018 3:42 PM   Sally Dunlap 1940/12/08 [id number]  Referring provider: Sofie Hartigan, [place],  [id number]  Chief Complaint  Patient presents with  . Follow-up    3 month f/u exam    HPI: Sally Dunlap is a 78 y.o. female Caucasian with mixed urinary incontinence, vaginal atrophy and pelvic pain who presents today for three month follow up with her daughter, [name].    The daughter states that her incontinence has worsened to the point that she would like her mother fitted for a pessary to see if it would help.    Her daughter states that she is wearing pads constantly now and has an increase in her incontinence frequency and volume.  Patient denies any gross hematuria, dysuria or suprapubic/flank pain.  Patient denies any fevers, chills, nausea or vomiting.   Her daughter states that she has had two UA's and urine cultures from her PCP's office and they were fine.  These were performed for an increase in urgency.  UA on 04/26/2018 was bland and urine culture was negative.  UA on 05/08/2018 was bland and urine culture was < 10,000 colonies.    She does not have a history of urinary tract infections, STI's or injury to the bladder.   She does not have a history of nephrolithiasis, GU surgery or GU trauma.   She is not sexually active.  She is post menopausal.  She admits to diarrhea.   She is drinking two bottles of water daily.   She is not drinking any coffee or tea.  She is drinks a beer/wine occasionally.   Her PVR was 0 mL.    Pelvic ultrasound on 02/05/2018 no acute abnormality within the pelvis.  Nonvisualization of the ovaries. No adnexal or pelvic mass identified.  Uterus and endometrial stripe within normal limits for age and postmenopausal status.   She was prescribed vaginal estrogen cream but had difficulty applying the cream.      PMH: Past Medical History:  Diagnosis Date  . Diverticulitis   . Headache   . Hearing  loss    hearing aidees  . Hyperlipidemia   . Hypertension   . Thyroid disease   . Varicose veins of both legs with edema     Surgical History: Past Surgical History:  Procedure Laterality Date  . CHOLECYSTECTOMY    . COLONOSCOPY W/ POLYPECTOMY    . KNEE ARTHROSCOPY     meniscus x 2 ; left  . KNEE SURGERY Right   . left ovary    . OVARY SURGERY      Home Medications:  Allergies as of 06/03/2018   No Known Allergies     Medication List       Accurate as of June 03, 2018  3:42 PM. Always use your most recent med list.        conjugated estrogens vaginal cream Commonly known as:  PREMARIN Apply 0.5mg  (pea-sized amount)  just inside the vaginal introitus with a finger-tip on  Monday, Wednesday and Friday nights.   donepezil 10 MG tablet Commonly known as:  ARICEPT Take 1 tablet (10 mg total) by mouth at bedtime.   escitalopram 5 MG tablet Commonly known as:  LEXAPRO   gabapentin 100 MG capsule Commonly known as:  NEURONTIN   levothyroxine 200 MCG tablet Commonly known as:  SYNTHROID, LEVOTHROID Take 200 mcg by mouth daily before breakfast.   Magnesium 400 MG Tabs Take by mouth.  nitrofurantoin (macrocrystal-monohydrate) 100 MG capsule Commonly known as:  MACROBID TK 1 C PO BID   Ospemifene 60 MG Tabs Take 1 tablet by mouth daily.   QUEtiapine 25 MG tablet Commonly known as:  SEROQUEL Take 1/2 tab to 1 tab nightly for sleep   ranitidine 150 MG capsule Commonly known as:  ZANTAC Take 150 mg by mouth daily.       Allergies: No Known Allergies  Family History: Family History  Problem Relation Age of Onset  . Colon cancer Mother   . Stroke Brother     Social History:  reports that she has never smoked. She has never used smokeless tobacco. She reports current alcohol use of about 1.0 standard drinks of alcohol per week. She reports that she does not use drugs.  ROS: UROLOGY Frequent Urination?: No Hard to postpone urination?:  No Burning/pain with urination?: No Get up at night to urinate?: No Leakage of urine?: Yes Urine stream starts and stops?: No Trouble starting stream?: No Do you have to strain to urinate?: No Blood in urine?: No Urinary tract infection?: No Sexually transmitted disease?: No Injury to kidneys or bladder?: No Painful intercourse?: No Weak stream?: No Currently pregnant?: No Vaginal bleeding?: No  Gastrointestinal Nausea?: No Vomiting?: No Indigestion/heartburn?: No Diarrhea?: No Constipation?: No  Constitutional Fever: No Night sweats?: No Weight loss?: No Fatigue?: No  Skin Skin rash/lesions?: No Itching?: No  Eyes Blurred vision?: No Double vision?: No  Ears/Nose/Throat Sore throat?: No Sinus problems?: No  Hematologic/Lymphatic Swollen glands?: No Easy bruising?: No  Cardiovascular Leg swelling?: No Chest pain?: No  Respiratory Cough?: No Shortness of breath?: No  Endocrine Excessive thirst?: No  Musculoskeletal Back pain?: No Joint pain?: No  Neurological Headaches?: No Dizziness?: No  Psychologic Depression?: No Anxiety?: No  Physical Exam: BP 126/72   Pulse 67   Temp (!) 97.3 F (36.3 C) (Oral)   Resp 16   Ht 5\' 2"  (1.575 m)   Wt 164 lb (74.4 kg)   BMI 30.00 kg/m   Constitutional:  Well nourished. Alert and oriented, No acute distress. HEENT: [place] AT, moist mucus membranes.  Trachea midline, no masses. Cardiovascular: No clubbing, cyanosis, or edema. Respiratory: Normal respiratory effort, no increased work of breathing. GI: Abdomen is soft, non tender, non distended, no abdominal masses. Liver and spleen not palpable.  No hernias appreciated.  Stool sample for occult testing is not indicated.   GU: No CVA tenderness.  No bladder fullness or masses.  Atrophic external genitalia, sparse pubic hair distribution, no lesions.  Normal urethral meatus, no lesions, no prolapse, no discharge.   No urethral masses, tenderness and/or  tenderness. No bladder fullness, tenderness or masses. Pale vagina mucosa, poor estrogen effect, no discharge, no lesions, poor pelvic support, grade II cystocele and no rectocele noted.  Urinary leakage demonstrated with Valsalva.  No cervical motion tenderness.  Uterus is freely mobile and non-fixed.  No adnexal/parametria masses or tenderness noted.  Anus and perineum are without rashes or lesions.    Skin: No rashes, bruises or suspicious lesions. Lymph: No inguinal adenopathy. Neurologic: Grossly intact, no focal deficits, moving all 4 extremities. Psychiatric: Normal mood and affect.   Laboratory Data: Lab Results  Component Value Date   WBC 5.6 01/09/2017   HGB 13.2 01/09/2017   HCT 37.8 01/09/2017   MCV 89.6 01/09/2017   PLT 206 01/09/2017    Lab Results  Component Value Date   CREATININE 0.90 01/09/2017    No results found  for: PSA  No results found for: TESTOSTERONE  No results found for: HGBA1C  Lab Results  Component Value Date   TSH 1.040 03/16/2016    No results found for: CHOL, HDL, CHOLHDL, VLDL, LDLCALC  Lab Results  Component Value Date   AST 27 03/16/2016   Lab Results  Component Value Date   ALT 23 03/16/2016   No components found for: ALKALINEPHOPHATASE No components found for: BILIRUBINTOTAL  No results found for: ESTRADIOL  Urinalysis    Component Value Date/Time   COLORURINE YELLOW 10/16/2014 [place] 10/16/2014 1825   LABSPEC 1.023 10/16/2014 1825   PHURINE 5.0 10/16/2014 1825   GLUCOSEU NEGATIVE 10/16/2014 1825   HGBUR NEGATIVE 10/16/2014 1825   BILIRUBINUR NEGATIVE 10/16/2014 1825   KETONESUR NEGATIVE 10/16/2014 1825   PROTEINUR NEGATIVE 10/16/2014 1825   UROBILINOGEN 0.2 10/16/2014 1825   NITRITE NEGATIVE 10/16/2014 1825   LEUKOCYTESUR SMALL (A) 10/16/2014 1825    I have reviewed the labs.   Pertinent Imaging: n/a  Assessment & Plan:  1. Mixed Incontinence Patient and her daughter would like a  referral to gynecology for pessary fitting at this time Patient's daughter states that they will be having PT evaluating her soon  2. Cystocele Refer on to gynecology for pessary fitting    3. Vaginal atrophy Patient is not applying the vaginal cream correctly, daughter would like an oral treatment as she does not feel comfortable applying the cream for her mother Prescription for Osphena sent in to her pharmacy - explained to daughter that it may be cost prohibitive     Return if symptoms worsen or fail to improve.  Laneta Simmers  [facility] [address] [place], [place] [address] [phone]

## 2018-06-03 ENCOUNTER — Encounter: Payer: Self-pay | Admitting: Urology

## 2018-06-03 ENCOUNTER — Other Ambulatory Visit: Payer: Self-pay

## 2018-06-03 ENCOUNTER — Ambulatory Visit (INDEPENDENT_AMBULATORY_CARE_PROVIDER_SITE_OTHER): Payer: Medicare Other | Admitting: Urology

## 2018-06-03 VITALS — BP 126/72 | HR 67 | Temp 97.3°F | Resp 16 | Ht 62.0 in | Wt 164.0 lb

## 2018-06-03 DIAGNOSIS — N8111 Cystocele, midline: Secondary | ICD-10-CM | POA: Diagnosis not present

## 2018-06-03 DIAGNOSIS — N952 Postmenopausal atrophic vaginitis: Secondary | ICD-10-CM | POA: Diagnosis not present

## 2018-06-03 DIAGNOSIS — N3946 Mixed incontinence: Secondary | ICD-10-CM | POA: Diagnosis not present

## 2018-06-03 MED ORDER — OSPEMIFENE 60 MG PO TABS: 1.0000 | ORAL_TABLET | Freq: Every day | ORAL | 3 refills | Status: AC

## 2018-06-04 ENCOUNTER — Telehealth: Payer: Self-pay | Admitting: Obstetrics and Gynecology

## 2018-06-04 NOTE — Telephone Encounter (Signed)
[facility] referring for Mixed incontinence,Cystocele, midline. Called and left voicemail for patient to call back to be schedule

## 2018-06-04 NOTE — Telephone Encounter (Signed)
Patient scheduled 3/12 with [facility]

## 2018-06-20 ENCOUNTER — Other Ambulatory Visit: Payer: Self-pay

## 2018-06-20 ENCOUNTER — Encounter: Payer: Self-pay | Admitting: Obstetrics & Gynecology

## 2018-06-20 ENCOUNTER — Ambulatory Visit (INDEPENDENT_AMBULATORY_CARE_PROVIDER_SITE_OTHER): Payer: Medicare Other | Admitting: Obstetrics & Gynecology

## 2018-06-20 VITALS — BP 120/80 | Ht 62.0 in | Wt 166.0 lb

## 2018-06-20 DIAGNOSIS — N819 Female genital prolapse, unspecified: Secondary | ICD-10-CM | POA: Diagnosis not present

## 2018-06-20 DIAGNOSIS — N3946 Mixed incontinence: Secondary | ICD-10-CM | POA: Insufficient documentation

## 2018-06-20 DIAGNOSIS — N8111 Cystocele, midline: Secondary | ICD-10-CM

## 2018-06-20 NOTE — Progress Notes (Signed)
Consultant: Zara Council (urology) Reason: Cystocele and incontinence  Cystocele/Rectocele Patient complains of a one year history of bladder pressure and incontinence that has worsened significantly, LOU w urgency as well as with activity.  Pt has dementia, and daughter assists with her care.  No bulge reported.  No bleeding. No hormone use.  Recent start of [place] for atrophy and dryness.  Not sexually active.. Problem started 1 year ago. Symptoms include: prolapse of tissue with straining, urinary incontinence: moderate and as above. Symptoms have gradually worsened.   PMHx: She  has a past medical history of Diverticulitis, Headache, Hearing loss, Hyperlipidemia, Hypertension, Thyroid disease, and Varicose veins of both legs with edema. Also,  has a past surgical history that includes Colonoscopy w/ polypectomy; left ovary; Knee arthroscopy; Cholecystectomy; Ovary surgery; and Knee surgery (Right)., family history includes Colon cancer in her mother; Stroke in her brother.,  reports that she has never smoked. She has never used smokeless tobacco. She reports current alcohol use of about 1.0 standard drinks of alcohol per week. She reports that she does not use drugs.  She has a current medication list which includes the following prescription(s): conjugated estrogens, donepezil, escitalopram, gabapentin, levothyroxine, magnesium, nitrofurantoin (macrocrystal-monohydrate), ospemifene, quetiapine, and ranitidine. Also, has No Known Allergies.  Review of Systems  Constitutional: Negative for chills, fever and malaise/fatigue.  HENT: Negative for congestion, sinus pain and sore throat.   Eyes: Negative for blurred vision and pain.  Respiratory: Negative for cough and wheezing.   Cardiovascular: Negative for chest pain and leg swelling.  Gastrointestinal: Negative for abdominal pain, constipation, diarrhea, heartburn, nausea and vomiting.  Genitourinary: Negative for dysuria, frequency,  hematuria and urgency.  Musculoskeletal: Negative for back pain, joint pain, myalgias and neck pain.  Skin: Negative for itching and rash.  Neurological: Negative for dizziness, tremors and weakness.  Endo/Heme/Allergies: Does not bruise/bleed easily.  Psychiatric/Behavioral: Negative for depression. The patient is not nervous/anxious and does not have insomnia.    Objective: BP 120/80   Ht 5\' 2"  (1.575 m)   Wt 166 lb (75.3 kg)   BMI 30.36 kg/m  Physical Exam Constitutional:      General: She is not in acute distress.    Appearance: She is well-developed.  Genitourinary:     Pelvic exam was performed with patient supine.     Vagina and uterus normal.     No vaginal erythema or bleeding.     No cervical motion tenderness, discharge, polyp or nabothian cyst.     Uterus is mobile.     Uterus is not enlarged.     No uterine mass detected.    Uterus is midaxial.     No right or left adnexal mass present.     Right adnexa not tender.     Left adnexa not tender.     Genitourinary Comments: Uterus well supported, small Gr 2 Cystocele Min rectocele Vaginal atrophy present  HENT:     Head: Normocephalic and atraumatic.     Nose: Nose normal.  Abdominal:     General: There is no distension.     Palpations: Abdomen is soft.     Tenderness: There is no abdominal tenderness.  Musculoskeletal: Normal range of motion.  Neurological:     Mental Status: She is alert and oriented to person, place, and time.     Cranial Nerves: No cranial nerve deficit.  Skin:    General: Skin is warm and dry.   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 She desires a pessary as her means of controlling her symptoms of prolapse and/or urinary incontinence. She understands the care needed for a pessary and desires to  proceed. Alternative treatment options have been discussed at length and the patient voices an understanding of each option.   PROCEDURE: The patient was placed in dorsal lithotomy position. Examination confirmed prolapse. A 3 ring pessary was fitted without difficulty. The patient subsequently ambulated, voided and performed valsalva maneuvers without dislodging the pessary and without discomfort.  Pessary model removed.    Care instructions were provided. Patient was discharged to home in stable condition.   Plan Incontinence ring #2, placed today  [name], MD, [facility], [facility] 06/20/2018  3:30 PM

## 2018-07-18 ENCOUNTER — Encounter: Payer: Self-pay | Admitting: Obstetrics & Gynecology

## 2018-07-18 ENCOUNTER — Ambulatory Visit (INDEPENDENT_AMBULATORY_CARE_PROVIDER_SITE_OTHER): Payer: Medicare Other | Admitting: Obstetrics & Gynecology

## 2018-07-18 ENCOUNTER — Other Ambulatory Visit: Payer: Self-pay

## 2018-07-18 VITALS — BP 118/90 | Wt 157.0 lb

## 2018-07-18 DIAGNOSIS — N819 Female genital prolapse, unspecified: Secondary | ICD-10-CM

## 2018-07-18 DIAGNOSIS — R32 Unspecified urinary incontinence: Secondary | ICD-10-CM

## 2018-07-18 DIAGNOSIS — R300 Dysuria: Secondary | ICD-10-CM | POA: Diagnosis not present

## 2018-07-18 DIAGNOSIS — N8111 Cystocele, midline: Secondary | ICD-10-CM

## 2018-07-18 DIAGNOSIS — N3946 Mixed incontinence: Secondary | ICD-10-CM | POA: Diagnosis not present

## 2018-07-18 DIAGNOSIS — N3000 Acute cystitis without hematuria: Secondary | ICD-10-CM

## 2018-07-18 LAB — POCT URINALYSIS DIPSTICK
Bilirubin, UA: NEGATIVE
Blood, UA: NEGATIVE
Glucose, UA: NEGATIVE
Ketones, UA: NEGATIVE
Nitrite, UA: NEGATIVE
Protein, UA: POSITIVE — AB
Spec Grav, UA: 1.025 (ref 1.010–1.025)
Urobilinogen, UA: 0.2 E.U./dL
pH, UA: 5 (ref 5.0–8.0)

## 2018-07-18 MED ORDER — SULFAMETHOXAZOLE-TRIMETHOPRIM 800-160 MG PO TABS: 1.0000 | ORAL_TABLET | Freq: Two times a day (BID) | ORAL | 0 refills | Status: AC

## 2018-07-18 NOTE — Progress Notes (Signed)
HPI:      [name] is a 78 y.o., presents today for a problem visit. She reports more continuous leakage of urine since having the pessary placed one month ago..  She leaks at night and during say with uncontrollable urges and release of large amounts of urine.    Prior to pessary, she felt like her bladder was dropping with lower pelvic pressure, but minimal incontinence then. She also reports some bowel incontinence at times (prior and now).    Urinary Tract Infection: Patient complains of burning with urination, diurnal enuresis and incontinence . She has had symptoms for several days. Patient also complains of vulvar irritation and pain. Patient denies fever. Patient does not have a history of recurrent UTI.  Patient does not have a history of pyelonephritis.   PMHx: She  has a past medical history of Diverticulitis, Headache, Hearing loss, Hyperlipidemia, Hypertension, Thyroid disease, and Varicose veins of both legs with edema. Also,  has a past surgical history that includes Colonoscopy w/ polypectomy; left ovary; Knee arthroscopy; Cholecystectomy; Ovary surgery; and Knee surgery (Right)., family history includes Colon cancer in her mother; Stroke in her brother.,  reports that she has never smoked. She has never used smokeless tobacco. She reports current alcohol use of about 1.0 standard drinks of alcohol per week. She reports that she does not use drugs.  She has a current medication list which includes the following prescription(s): donepezil, escitalopram, gabapentin, levothyroxine, ospemifene, quetiapine, ranitidine, and sulfamethoxazole-trimethoprim. Also, has No Known Allergies.  Review of Systems  Constitutional: Negative for chills, fever and malaise/fatigue.  HENT: Negative for congestion, sinus pain and sore throat.   Eyes: Negative for blurred vision and pain.  Respiratory: Negative for cough and wheezing.   Cardiovascular: Negative for chest pain and leg  swelling.  Gastrointestinal: Negative for abdominal pain, constipation, diarrhea, heartburn, nausea and vomiting.  Genitourinary: Negative for dysuria, frequency, hematuria and urgency.  Musculoskeletal: Negative for back pain, joint pain, myalgias and neck pain.  Skin: Negative for itching and rash.  Neurological: Negative for dizziness, tremors and weakness.  Endo/Heme/Allergies: Does not bruise/bleed easily.  Psychiatric/Behavioral: Negative for depression. The patient is not nervous/anxious and does not have insomnia.     Objective: BP 118/90   Wt 157 lb (71.2 kg)   BMI 28.72 kg/m  Physical Exam Constitutional:      General: She is not in acute distress.    Appearance: She is well-developed.  Genitourinary:     Pelvic exam was performed with patient supine.     Vagina and uterus normal.     No vaginal erythema or bleeding.     No cervical motion tenderness, discharge, polyp or nabothian cyst.     Uterus is mobile.     Uterus is not enlarged.     No uterine mass detected.    Uterus is midaxial.     No right or left adnexal mass present.     Right adnexa not tender.     Left adnexa not tender.     Genitourinary Comments: Uterus well supported, small Gr 2 Cystocele Min rectocele Vaginal atrophy present  Vulvar erythema present  HENT:     Head: Normocephalic and atraumatic.     Nose: Nose normal.  Abdominal:     General: There is no distension.     Palpations: Abdomen is soft.     Tenderness: There is no abdominal tenderness.  Musculoskeletal: Normal range of motion.  Neurological:  Mental Status: She is alert and oriented to person, place, and time.     Cranial Nerves: No cranial nerve deficit.  Skin:    General: Skin is warm and dry.     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  [name], MD, [facility], [facility] 07/18/2018  4:26 PM

## 2018-07-20 LAB — URINE CULTURE

## 2018-08-26 ENCOUNTER — Telehealth: Payer: Self-pay | Admitting: Family Medicine

## 2018-08-27 ENCOUNTER — Telehealth: Payer: Self-pay | Admitting: Nurse Practitioner

## 2018-08-27 NOTE — Telephone Encounter (Signed)
Talked with daughter Aundra Dubin and have scheduled a Telehealth Palliative consult visit for 09/06/27 @ 2 PM.

## 2018-09-03 NOTE — Telephone Encounter (Signed)
At the request of Christin Gusler, NP called daughter to change the consult date and or time due to a conflict in her schedule, daughter requested to keep the date and change the time of the consult to 3:30 PM on Friday 09/06/18.

## 2018-09-04 IMAGING — CT CT HEAD W/O CM
3 series · 14 of 45 positions shown, 16 images · non-contrast
Comparison: CT head 05/28/2015.  MRI brain 03/27/2016

CLINICAL DATA: TIA. Numbness in the left side of the face for 2
hours. Pain and numbness in the left arm. Dizziness and shortness of
breath.

EXAM:
CT HEAD WITHOUT CONTRAST
TECHNIQUE: Contiguous axial images were obtained from the base of the skull
through the vertex without intravenous contrast.

[Series 2: head wo · axial · 0.41mm/px · z∈[-230,-115]mm · 8 of 28 slices shown, 10 images]
[im 3/28  brain]
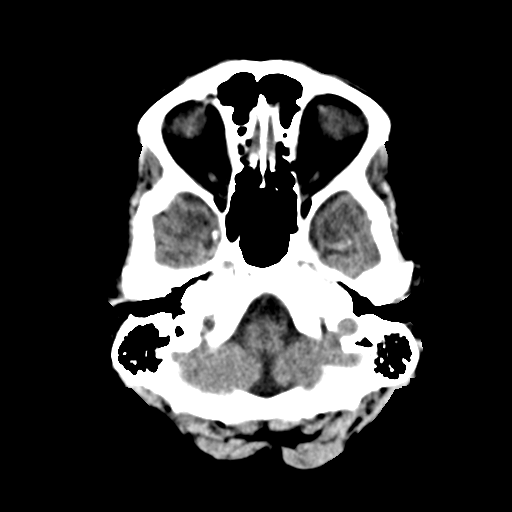
[im 3/28  bone]
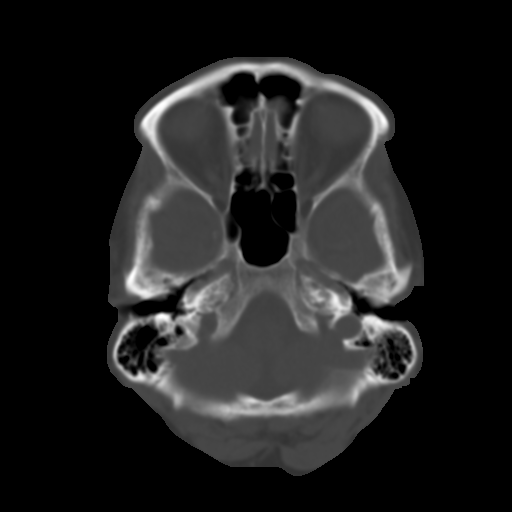
[im 6/28  brain]
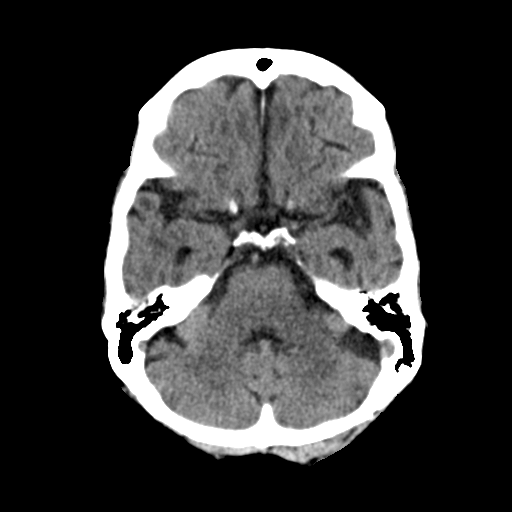
[im 10/28  brain]
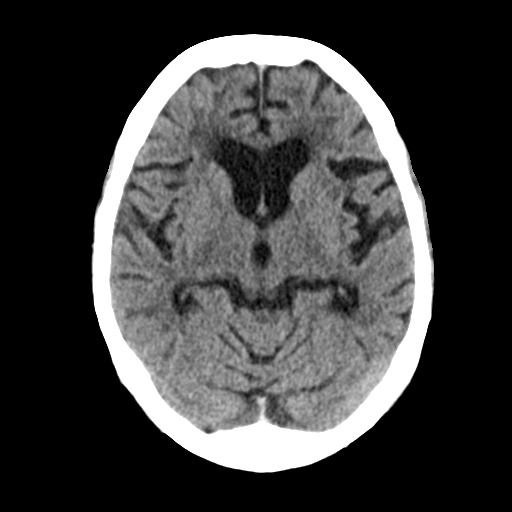
[im 13/28  brain]
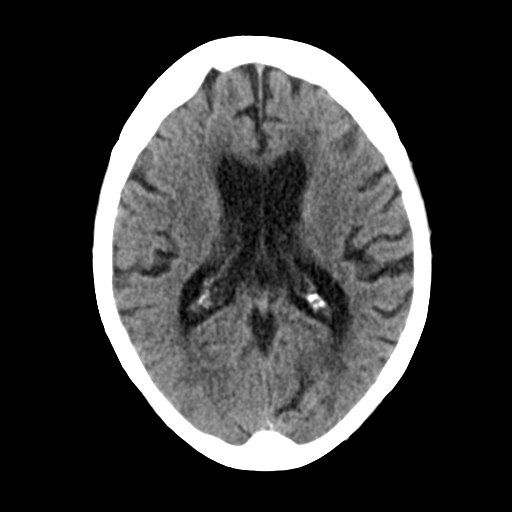
[im 16/28  brain]
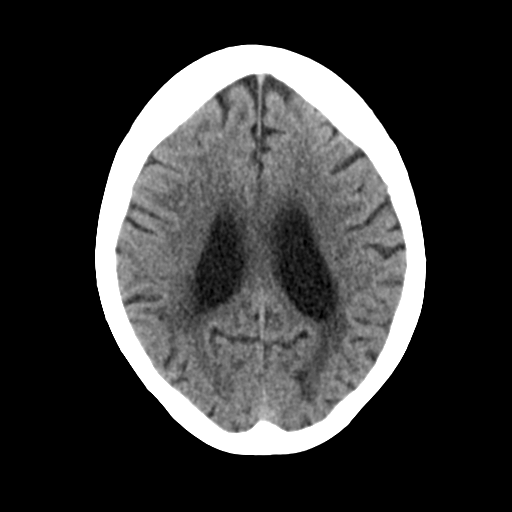
[im 16/28  bone]
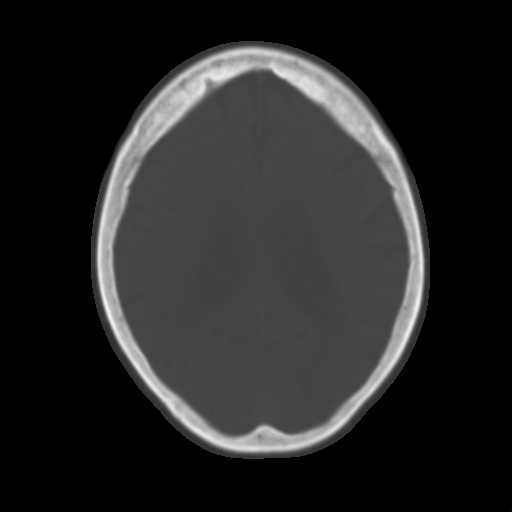
[im 19/28  brain]
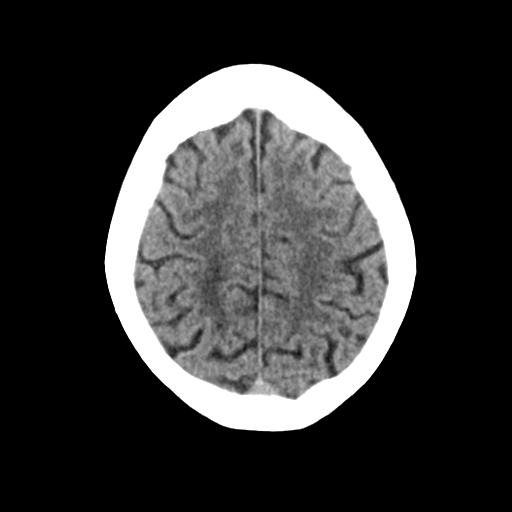
[im 23/28  brain]
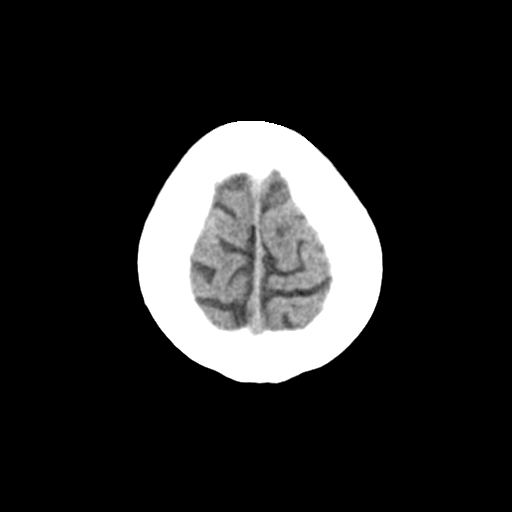
[im 26/28  brain]
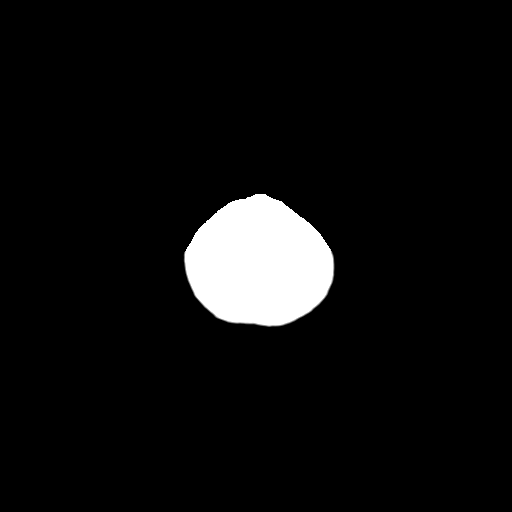

[Series 4: coronal soft tissue · coronal · 0.28mm/px · 3 of 61 slices shown]
[im 21/61  brain]
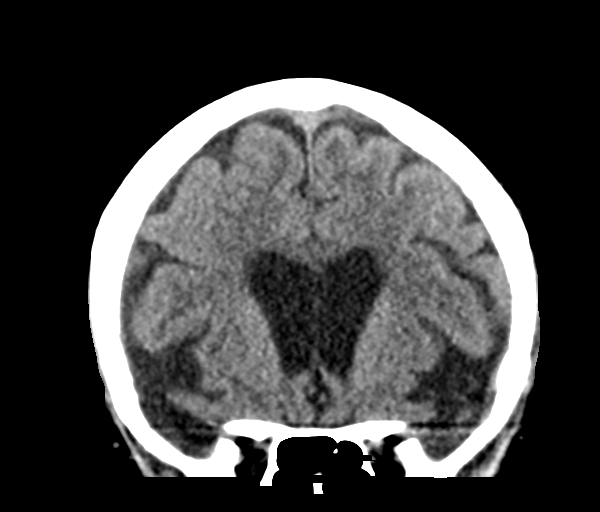
[im 27/61  brain]
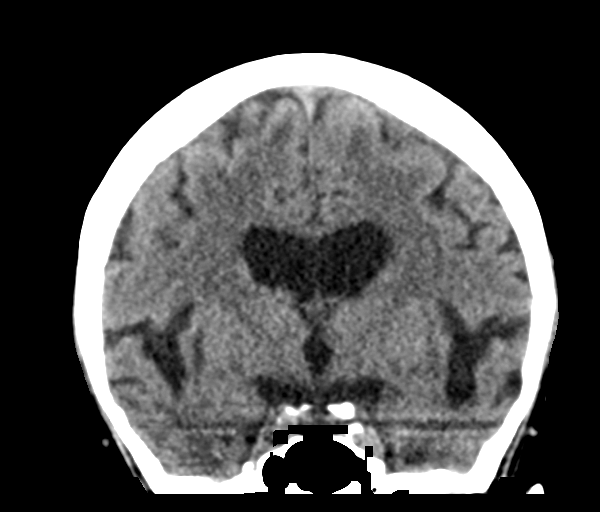
[im 34/61  brain]
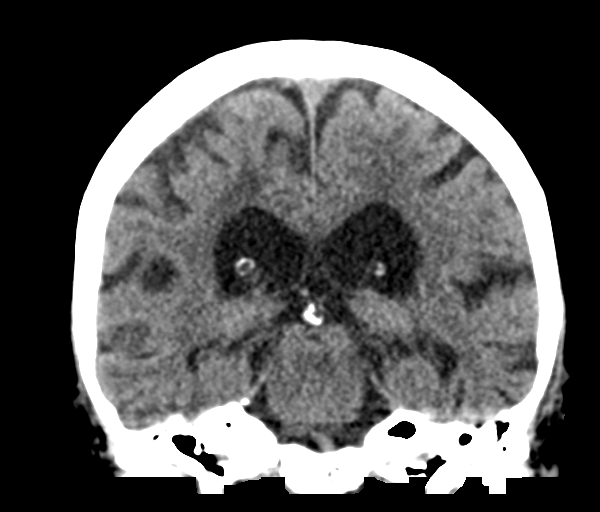

[Series 5: sagittal soft tissue · sagittal · 0.27mm/px · 3 of 48 slices shown]
[im 16/48  brain]
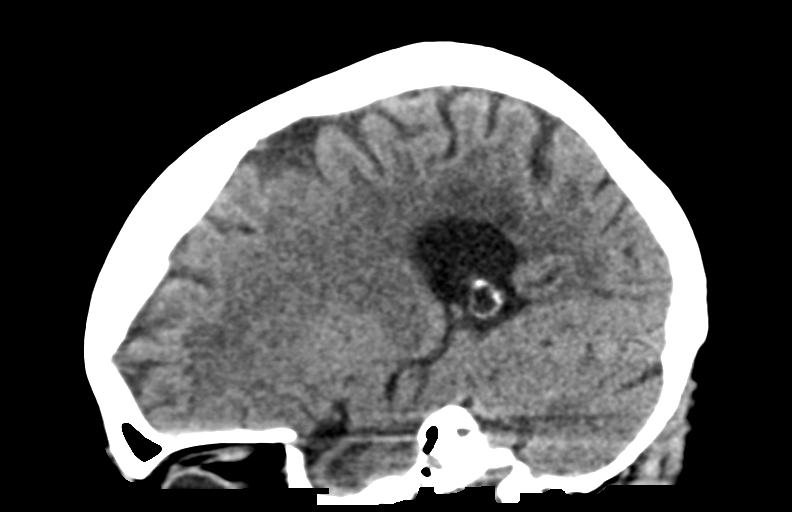
[im 24/48  brain]
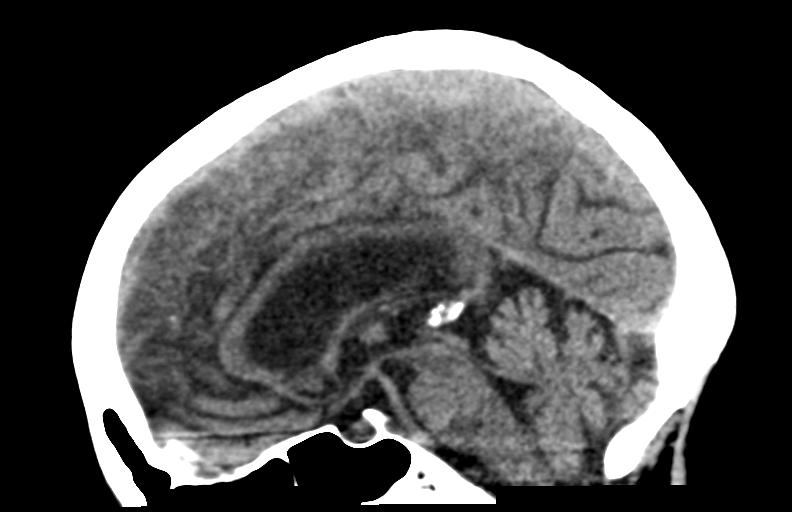
[im 32/48  brain]
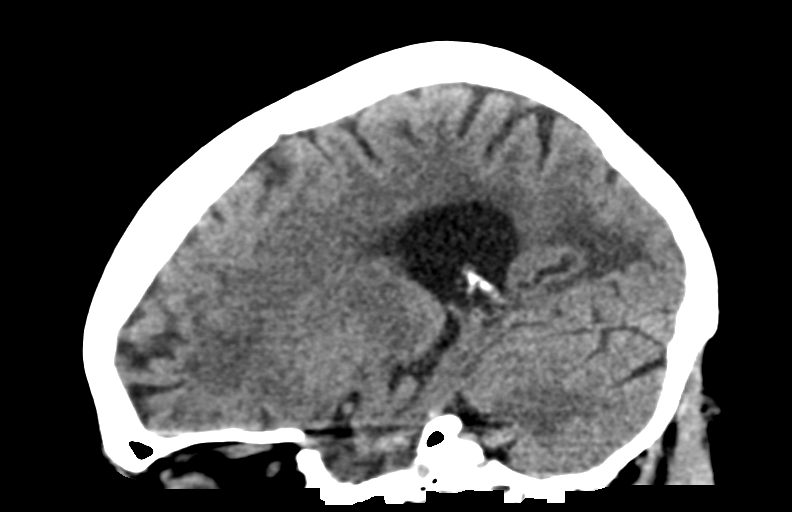

[14 of 45 positions shown; findings below may reference images not displayed]

FINDINGS: Brain: Mild diffuse cerebral atrophy. Ventricular dilatation
consistent with central atrophy. Low-attenuation changes throughout
the deep white matter consistent with small vessel ischemia. No mass
effect or midline shift. No abnormal extra-axial fluid collections.
Gray-white matter junctions are distinct. Basal cisterns are not
effaced. No acute intracranial hemorrhage.

Vascular: Vascular calcifications in the internal carotid arteries.

Skull: Calvarium appears intact.

Sinuses/Orbits: Paranasal sinuses and mastoid air cells are clear.

Other: No significant changes since the previous study.
IMPRESSION: No acute intracranial abnormalities. Chronic atrophy and small
vessel ischemic changes.

## 2018-09-06 ENCOUNTER — Other Ambulatory Visit: Payer: 59 | Admitting: Nurse Practitioner

## 2018-09-06 ENCOUNTER — Other Ambulatory Visit: Payer: Self-pay

## 2018-09-06 ENCOUNTER — Other Ambulatory Visit: Payer: Medicare Other | Admitting: Nurse Practitioner

## 2018-09-06 ENCOUNTER — Encounter: Payer: Self-pay | Admitting: Nurse Practitioner

## 2018-09-06 DIAGNOSIS — R451 Restlessness and agitation: Secondary | ICD-10-CM | POA: Insufficient documentation

## 2018-09-06 DIAGNOSIS — R413 Other amnesia: Secondary | ICD-10-CM

## 2018-09-06 DIAGNOSIS — Z515 Encounter for palliative care: Secondary | ICD-10-CM | POA: Insufficient documentation

## 2018-09-06 NOTE — Progress Notes (Signed)
[facility] Consult Note Telephone: [phone]  Fax: [phone]  PATIENT NAME: Sally Dunlap DOB: 1940-11-02 MRN: [id number]  PRIMARY CARE PROVIDER:   Sofie Hartigan, MD  REFERRING PROVIDER:  Sofie Hartigan, MD [place], [place] [address]  RESPONSIBLE PARTY:   Daughter Darlyn Read [phone]   Due to the COVID-19 crisis, this visit was done via telemedicine from my office and it was initiated and consent by this patient and or family.  I was asked by Dr. Ellison Hughs to see for Palliative care consult for goc  RECOMMENDATIONS and PLAN:  1. Palliative care encounter Z51.5; Palliative medicine team will continue to support patient, patient's family, and medical team. Visit consisted of counseling and education dealing with the complex and emotionally intense issues of symptom management and palliative care in the setting of serious and potentially life-threatening illness  Recommend Hospice screening  2. Memory loss R41.3 secondary to dementia. Continue with supportive measures as chronic disease remains Progressive.  3. Agitation R45.1; continue ongoing emotional support, Continue with seroquel. Monitor behaviors, redirection  ASSESSMENT:     Telehealth is it by telephone as unable to get video equipment working.   I called Darlyn Read, Ms Sedgwick's daughter. We talked about how she was doing today. We talked about the last time she was independent at home which was a little over three years ago. We talked about past medical history including dementia with progression. We talked about her functional and cognitive abilities. We talked about hospice screening. Discussed her functional and cognitive abilities at that time. Since the hospice training she has rapidly declined. She is no longer able to walk. She is no longer able to sit up and stand on her own. She is total ADL dependent. She is now incontinent bowel and  bladder. She does continue to have intermittent diarrhea though taking Lomotil which has helped. She is able to feed herself but appetite is declined. Cognitively her words have been sporadic and declined. We talked about medical goals of care including aggressive versus conservative versus comfort care. Wishes are to be comfort care only. Wishes are to keep her in her at home when she passes. She is a DNR though [place] form not in home. Will complete. We talked about the option of hospice re-screening. We talked about services that hospice would be able to provide. Darlyn Read endorses that she has [facility] CNA service being provided at present time. [name] also was in agreement to have hospice re-screen her for services. Discussed will contact primary provider to resend hospice order screening. Talked about role of palliative care and plan of care. Therapeutic listening and emotional support provided. Contact information provided. Questions answered to satisfaction  I spent 60 minutes providing this consultation,  from 12:30pm to 1:30pm. More than 50% of the time in this consultation was spent coordinating communication.   HISTORY OF PRESENT ILLNESS:  Sally Dunlap is a 78 y.o. year old female with multiple medical problems including Vascular dementia Alzheimer's dementia,  cerebral amyloid angiopathy, hypertension, hyperlipidemia, varicose veins both legs with edema, hypothyroidism disease, history of headache, diverticulitis, adenovillous polyp of colon, hearing loss, ovarian surgery, right knee surgery, cholecystectomy, urinary incontinence. 4 / 9 / 2020 seemed by [facility] for pessary placed 1 month ago and she continues to leak during the night with uncontrollable urges and large releases of urine.  5 / 74 / 2020 neurology visit with daughter calling concerned not sleeping well  high anxiety staying up through the night. On Seroquel 25 mg nightly with rapid decline. Hospice screening   5 / 13 / 2020 and was found not eligible as her functional level she had intermittent incontinence, able to ambulate with a rolaid walker, moderate confusion but able to answer simple questions at that time. She was able to toilet herself and wears Pull-Ups. Palliative care visit today by telephone with daughter. Patient is now non-ambulatory, unable to get up from a sitting position on the bed or couch on her own. She is a lift to transfer. She is unable get to the bathroom as she's now completely incontinent bowel and bladder. She is able to feed herself though appetite has declined. Daughter shared she is more confused. Daughter endorses she hired [facility] instead to come help her with cna service. She is very weak and sleeping more. 07/18/2018 Weight 157. Palliative Care was asked to help address goals of care.   CODE STATUS: DNR  PPS: 30% HOSPICE ELIGIBILITY/DIAGNOSIS: possible <6 month with clinical presentation  PAST MEDICAL HISTORY:  Past Medical History:  Diagnosis Date  . Diverticulitis   . Headache   . Hearing loss    hearing aidees  . Hyperlipidemia   . Hypertension   . Thyroid disease   . Varicose veins of both legs with edema     SOCIAL HX:  Social History   Tobacco Use  . Smoking status: Never Smoker  . Smokeless tobacco: Never Used  Substance Use Topics  . Alcohol use: Yes    Alcohol/week: 1.0 standard drinks    Types: 1 Glasses of wine per week    Comment: occaisionaly    ALLERGIES: No Known Allergies   PERTINENT MEDICATIONS:  Outpatient Encounter Medications as of 09/06/2018  Medication Sig  . donepezil (ARICEPT) 10 MG tablet Take 1 tablet (10 mg total) by mouth at bedtime.  Marland Kitchen escitalopram (LEXAPRO) 5 MG tablet   . gabapentin (NEURONTIN) 100 MG capsule   . levothyroxine (SYNTHROID, LEVOTHROID) 200 MCG tablet Take 200 mcg by mouth daily before breakfast.  . Ospemifene 60 MG TABS Take 1 tablet by mouth daily.  . QUEtiapine (SEROQUEL) 25 MG tablet Take 1/2 tab to 1  tab nightly for sleep  . ranitidine (ZANTAC) 150 MG capsule Take 150 mg by mouth daily.   No facility-administered encounter medications on file as of 09/06/2018.     PHYSICAL EXAM:   Deferred  Ashlee Player Z Gulianna Hornsby, NP

## 2018-10-09 DEATH — deceased
